# Patient Record
Sex: Male | Born: 1990 | Race: White | Hispanic: No | Marital: Married | Smoking: Never smoker
Health system: Southern US, Community
[De-identification: ages and names within clinical notes are randomized; demographics above are authoritative.]

## PROBLEM LIST (undated history)

## (undated) DIAGNOSIS — F419 Anxiety disorder, unspecified: Secondary | ICD-10-CM

## (undated) DIAGNOSIS — T7840XA Allergy, unspecified, initial encounter: Secondary | ICD-10-CM

## (undated) DIAGNOSIS — F32A Depression, unspecified: Secondary | ICD-10-CM

## (undated) DIAGNOSIS — J329 Chronic sinusitis, unspecified: Secondary | ICD-10-CM

## (undated) DIAGNOSIS — F329 Major depressive disorder, single episode, unspecified: Secondary | ICD-10-CM

## (undated) HISTORY — DX: Allergy, unspecified, initial encounter: T78.40XA

## (undated) HISTORY — DX: Major depressive disorder, single episode, unspecified: F32.9

## (undated) HISTORY — DX: Depression, unspecified: F32.A

## (undated) HISTORY — DX: Anxiety disorder, unspecified: F41.9

---
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
## 2013-05-01 ENCOUNTER — Ambulatory Visit (INDEPENDENT_AMBULATORY_CARE_PROVIDER_SITE_OTHER): Payer: BC Managed Care – PPO | Admitting: Family Medicine

## 2013-05-01 ENCOUNTER — Ambulatory Visit: Payer: BC Managed Care – PPO

## 2013-05-01 VITALS — BP 140/88 | HR 85 | Temp 98.1°F | Resp 18 | Ht 72.5 in | Wt 251.0 lb

## 2013-05-01 DIAGNOSIS — K219 Gastro-esophageal reflux disease without esophagitis: Secondary | ICD-10-CM

## 2013-05-01 DIAGNOSIS — R062 Wheezing: Secondary | ICD-10-CM

## 2013-05-01 DIAGNOSIS — R05 Cough: Secondary | ICD-10-CM

## 2013-05-01 DIAGNOSIS — R059 Cough, unspecified: Secondary | ICD-10-CM

## 2013-05-01 DIAGNOSIS — R0602 Shortness of breath: Secondary | ICD-10-CM

## 2013-05-01 LAB — POCT CBC
Granulocyte percent: 52.6 %G (ref 37–80)
HCT, POC: 49.6 % (ref 43.5–53.7)
Hemoglobin: 16.4 g/dL (ref 14.1–18.1)
Lymph, poc: 3.7 — AB (ref 0.6–3.4)
MCH, POC: 29.1 pg (ref 27–31.2)
MCHC: 33.1 g/dL (ref 31.8–35.4)
MCV: 88.1 fL (ref 80–97)
MID (cbc): 1 — AB (ref 0–0.9)
MPV: 8.9 fL (ref 0–99.8)
POC Granulocyte: 5.2 (ref 2–6.9)
POC LYMPH PERCENT: 37.8 %L (ref 10–50)
POC MID %: 9.6 %M (ref 0–12)
Platelet Count, POC: 334 10*3/uL (ref 142–424)
RBC: 5.63 M/uL (ref 4.69–6.13)
RDW, POC: 13.6 %
WBC: 9.9 10*3/uL (ref 4.6–10.2)

## 2013-05-01 MED ORDER — RANITIDINE HCL 150 MG PO TABS
150.0000 mg | ORAL_TABLET | Freq: Two times a day (BID) | ORAL | Status: DC
Start: 2013-05-01 — End: 2013-10-31

## 2013-05-01 MED ORDER — FLUTICASONE FUROATE 200 MCG/ACT IN AEPB: 1.0000 | INHALATION_SPRAY | Freq: Every day | RESPIRATORY_TRACT | Status: DC

## 2013-05-01 MED ORDER — ALBUTEROL SULFATE (2.5 MG/3ML) 0.083% IN NEBU
2.5000 mg | INHALATION_SOLUTION | Freq: Once | RESPIRATORY_TRACT | Status: AC
  Administered 2013-05-01: 2.5 mg via RESPIRATORY_TRACT

## 2013-05-01 MED ORDER — HYDROCOD POLST-CHLORPHEN POLST 10-8 MG/5ML PO LQCR: 5.0000 mL | Freq: Two times a day (BID) | ORAL | Status: DC | PRN

## 2013-05-01 MED ORDER — METHYLPREDNISOLONE ACETATE 80 MG/ML IJ SUSP
80.0000 mg | Freq: Once | INTRAMUSCULAR | Status: AC
  Administered 2013-05-01: 80 mg via INTRAMUSCULAR

## 2013-05-01 MED ORDER — IPRATROPIUM BROMIDE 0.02 % IN SOLN
0.5000 mg | Freq: Once | RESPIRATORY_TRACT | Status: AC
  Administered 2013-05-01: 0.5 mg via RESPIRATORY_TRACT

## 2013-05-01 MED ORDER — ALBUTEROL SULFATE HFA 108 (90 BASE) MCG/ACT IN AERS: 2.0000 | INHALATION_SPRAY | RESPIRATORY_TRACT | Status: DC | PRN

## 2013-05-01 NOTE — Progress Notes (Signed)
Subjective:    Patient ID: Justin Jordan, male    DOB: Jun 09, 1990, 23 y.o.   MRN: [id number]  HPI 23 year old male presents for evaluation of cough, nasal congestion, wheezing, SOB, sinus pressure, and chest tightness x 3 months.  He has been seen at another UC and treated with a 2 week course of erythromycin and prednisone in January and then with amoxicillin last week. States neither course of antibiotics helped at all. He has continued to be short of breath and have wheezing, especially at night.  Has not used any inhalers during the course of this illness.  Denies fever, chills, nausea, vomiting, otalgia, or sore throat. Does have quite a bit of nasal drainage and mucous.   No hx of asthma and has never used inhalers before.  Denies any use of cigarettes.  He is otherwise healthy with no other concerns today.  Lives with his girlfriend who has similar symptoms but has not been sick for as long.      Review of Systems  Constitutional: Negative for fever and chills.  HENT: Positive for congestion, postnasal drip, rhinorrhea and sinus pressure. Negative for sore throat and trouble swallowing.   Respiratory: Positive for cough, chest tightness, shortness of breath and wheezing.   Gastrointestinal: Negative for nausea and vomiting.  Neurological: Negative for dizziness and headaches.       Objective:   Physical Exam  Constitutional: He is oriented to person, place, and time. He appears well-developed and well-nourished.  HENT:  Head: Normocephalic and atraumatic.  Right Ear: Hearing, tympanic membrane, external ear and ear canal normal.  Left Ear: Hearing, tympanic membrane, external ear and ear canal normal.  Mouth/Throat: Uvula is midline, oropharynx is clear and moist and mucous membranes are normal.  Eyes: Conjunctivae are normal.  Neck: Normal range of motion. Neck supple.  Cardiovascular: Normal rate, regular rhythm and normal heart sounds.   Pulmonary/Chest: Effort normal.  He has wheezes.  Lymphadenopathy:    He has no cervical adenopathy.  Neurological: He is alert and oriented to person, place, and time.  Psychiatric: He has a normal mood and affect. His behavior is normal. Judgment and thought content normal.   First breathing tx with albuterol/atrovent - increased wheezing. Second tx with albuterol, wheezing improved slightly.   [facility] reading (PRIMARY) by  Dr. Milus Glazier as no acute infiltrate or consolidation.  Pre-nebulizer peak flow 500 Post is 550    Assessment & Plan:  Cough - Plan: POCT CBC, DG Chest 2 View, chlorpheniramine-HYDROcodone (TUSSIONEX PENNKINETIC ER) 10-8 MG/5ML LQCR, Ambulatory referral to Allergy, albuterol (PROVENTIL) (2.5 MG/3ML) 0.083% nebulizer solution 2.5 mg  Wheezing - Plan: DG Chest 2 View, albuterol (PROVENTIL) (2.5 MG/3ML) 0.083% nebulizer solution 2.5 mg, ipratropium (ATROVENT) nebulizer solution 0.5 mg, Ambulatory referral to Allergy, methylPREDNISolone acetate (DEPO-MEDROL) injection 80 mg, albuterol (PROVENTIL) (2.5 MG/3ML) 0.083% nebulizer solution 2.5 mg, albuterol (PROVENTIL HFA;VENTOLIN HFA) 108 (90 BASE) MCG/ACT inhaler  Shortness of breath - Plan: Fluticasone Furoate (ARNUITY ELLIPTA) 200 MCG/ACT AEPB, Ambulatory referral to Allergy, methylPREDNISolone acetate (DEPO-MEDROL) injection 80 mg, albuterol (PROVENTIL) (2.5 MG/3ML) 0.083% nebulizer solution 2.5 mg  GERD (gastroesophageal reflux disease) - Plan: ranitidine (ZANTAC) 150 MG tablet  Due to length of illness, I feel that a referral to Pulm/Allergy is indicated. He has no hx of asthma or RAD he may have developed an allergy to dust or mold that is in his house.  Will treat with Arnuity inhaler daily and albuterol to use q4-6hours prn wheezing.  Tussionex  at bedtime.  Recommend Zyrtec daily in the morning and Zantac bid.  RTC precautions discussed.  Referral placed for allergy eval - may follow up here as needed.

## 2013-05-01 NOTE — Patient Instructions (Addendum)
Take Zyrtec daily in the morning and Zantac twice daily Use tussionex cough syrup 1 teaspoon at bedtime Inhaler to use once daily Albuterol inhalter every 4-6 hours Referral to allergist placed.

## 2013-05-03 ENCOUNTER — Telehealth: Payer: Self-pay

## 2013-05-03 ENCOUNTER — Institutional Professional Consult (permissible substitution): Payer: Self-pay | Admitting: Internal Medicine

## 2013-05-03 NOTE — Telephone Encounter (Signed)
PA needed for ;arnuity ellipta inhaler. Since pt has not previously tried/failed any alternatives this PA would not be approved. Can we send in Rx for alternative please?

## 2013-05-04 ENCOUNTER — Encounter: Payer: Self-pay | Admitting: Internal Medicine

## 2013-05-04 ENCOUNTER — Ambulatory Visit (INDEPENDENT_AMBULATORY_CARE_PROVIDER_SITE_OTHER): Payer: BC Managed Care – PPO | Admitting: Internal Medicine

## 2013-05-04 VITALS — BP 134/82 | HR 63 | Temp 97.3°F | Ht 72.0 in | Wt 252.0 lb

## 2013-05-04 DIAGNOSIS — J45909 Unspecified asthma, uncomplicated: Secondary | ICD-10-CM

## 2013-05-04 MED ORDER — AMOXICILLIN-POT CLAVULANATE 875-125 MG PO TABS: 1.0000 | ORAL_TABLET | Freq: Two times a day (BID) | ORAL | Status: DC

## 2013-05-04 MED ORDER — PREDNISONE 10 MG PO TABS: ORAL_TABLET | ORAL | Status: DC

## 2013-05-04 MED ORDER — BUDESONIDE-FORMOTEROL FUMARATE 160-4.5 MCG/ACT IN AERO: INHALATION_SPRAY | RESPIRATORY_TRACT | Status: DC

## 2013-05-04 NOTE — Patient Instructions (Signed)
Augmentin 875 mg take one pill twice daily  X 10 days - take at breakfast and supper with large glass of water.  It would help reduce the usual side effects (diarrhea and yeast infections) if you ate cultured yogurt at lunch.   Prednisone 10 mg take  4 each am x 2 days,   2 each am x 2 days,  1 each am x 2 days and stop   Zantac 150 mg one after breakfast  and at bedtime  symbicort 160 Take 2 puffs first thing in am and then another 2 puffs about 12 hours later.    Only use your albuterol as a rescue medication to be used if you can't catch your breath by resting or doing a relaxed purse lip breathing pattern.  - The less you use it, the better it will work when you need it. - Ok to use up to 2 puffs  every 4 hours if you must but call for immediate appointment if use goes up over your usual need - Don't leave home without it !!  (think of it like the spare tire for your car)    Please schedule a follow up office visit in 2 weeks, sooner if needed

## 2013-05-04 NOTE — Progress Notes (Signed)
Subjective:    Patient ID: Justin ConnorsMichael Jordan, male    DOB: [birth year]  MRN: [id number]  HPI  [age] yowm never smoker  with pattern of recurrent cough in setting of earaches, nasal congestion, sorethroat usually fall > spring "for as long as he can remember"  but sometimes also early summer and always better p prednisone until the same pattern late November and needing saba in addition to otc but still not back to baseline  so referred 05/04/2013  by [facility] for cough.   05/04/2013 1st [facility] office visit/ Alexandra Lipps  Chief Complaint  Patient presents with  . Pulmonary Consult    Referred per Rhoderick MoodyHeather Marte, PA. Pt c/o cough since Nov 2014- prod with minimal green brown sputum. He also c/o SOB over the past 3-4 wks- esp first thing in the am.    as soon sit up in am starts coughing and gagging and also when lies down freq awakening better p saba. Sob even if not coughing - using albuterol up to 4 x daily   No obvious other patterns in day to day or daytime variabilty or assoc  cp or chest tightness, subjective wheeze overt  symptoms. No unusual exp hx or h/o childhood pna/ asthma or knowledge of premature birth.  Sleeping ok without nocturnal  or early am exacerbation  of respiratory  c/o's or need for noct saba. Also denies any obvious fluctuation of symptoms with weather or environmental changes or other aggravating or alleviating factors except as outlined above   Current Medications, Allergies, Complete Past Medical History, Past Surgical History, Family History, and Social History were reviewed in [facility] electronic medical record.             Review of Systems  Constitutional: Negative for fever, chills, activity change, appetite change and unexpected weight change.  HENT: Positive for congestion and trouble swallowing. Negative for dental problem, postnasal drip, rhinorrhea, sneezing, sore throat and voice change.   Eyes: Negative for visual disturbance.  Respiratory:  Positive for cough and shortness of breath. Negative for choking.   Cardiovascular: Negative for chest pain and leg swelling.  Gastrointestinal: Negative for nausea, vomiting and abdominal pain.  Genitourinary: Negative for difficulty urinating.       Heartburn Indigestion  Musculoskeletal: Negative for arthralgias.  Skin: Negative for rash.  Psychiatric/Behavioral: Negative for behavioral problems and confusion.       Objective:   Physical Exam  amb wm nad  Wt Readings from Last 3 Encounters:  05/04/13 252 lb (114.306 kg)  05/01/13 251 lb (113.853 kg)  12/21/08 221 lb (100.245 kg) (98%*, Z = 2.03)   * Growth percentiles are based on CDC 2-20 Years data.     HEENT: nl dentition, turbinates, and orophanx. Nl external ear canals without cough reflex   NECK :  without JVD/Nodes/TM/ nl carotid upstrokes bilaterally   LUNGS: no acc muscle use, clear to A and P bilaterally p am albuterol without cough on insp or exp maneuvers   CV:  RRR  no s3 or murmur or increase in P2, no edema   ABD:  soft and nontender with nl excursion in the supine position. No bruits or organomegaly, bowel sounds nl  MS:  warm without deformities, calf tenderness, cyanosis or clubbing  SKIN: warm and dry without lesions    NEURO:  alert, approp, no deficits     CXR  04/1613 .The heart size and mediastinal contours are within normal limits.  Both lungs are clear.  The visualized skeletal structures are  unremarkable.         Assessment & Plan:

## 2013-05-05 NOTE — Telephone Encounter (Signed)
We have coupons for 1 yr of this medication free - can give one to pt, or can change medications - whichever pt would prefer

## 2013-05-05 NOTE — Telephone Encounter (Signed)
Spoke to pt- Pt has already had a discount card and has been getting his rx free since his visit. No note in chart that indicated pt received discount card.

## 2013-05-06 DIAGNOSIS — J45909 Unspecified asthma, uncomplicated: Secondary | ICD-10-CM | POA: Insufficient documentation

## 2013-05-06 HISTORY — DX: Unspecified asthma, uncomplicated: J45.909

## 2013-05-06 NOTE — Assessment & Plan Note (Addendum)
DDX of  difficult airways managment all start with A and  include Adherence, Ace Inhibitors, Acid Reflux, Active Sinus Disease, Alpha 1 Antitripsin deficiency, Anxiety masquerading as Airways dz,  ABPA,  allergy(esp in young), Aspiration (esp in elderly), Adverse effects of DPI,  Active smokers, plus two Bs  = Bronchiectasis and Beta blocker use..and one C= CHF  In this case Adherence is the biggest issue and starts with  inability to use HFA effectively and also  understand that SABA treats the symptoms but doesn't get to the underlying problem (inflammation).  I used  the analogy of putting steroid cream on a rash to help explain the meaning of topical therapy and the need to get the drug to the target tissue.   - The proper method of use, as well as anticipated side effects, of a metered-dose inhaler are discussed and demonstrated to the patient. Improved effectiveness after extensive coaching during this visit to a level of approximately  75% so start on symbicort 160 2bid and see if alb dependency is eliminated   ? Acid (or non-acid) GERD > always difficult to exclude as up to 75% of pts in some series report no assoc GI/ Heartburn symptoms> rec max (24h)  acid suppression and diet restrictions/ reviewed and instructions given in writing.     ? Active sinus dz > Augmentin 875 mg take one pill twice daily  X 10 days -  Sinus ct on return if not better.   ? Allergy > suggested by seasonal pattern > Prednisone 10 mg take  4 each am x 2 days,   2 each am x 2 days,  1 each am x 2 days and stop and allergy profile on return

## 2013-05-18 ENCOUNTER — Encounter: Payer: Self-pay | Admitting: Internal Medicine

## 2013-05-18 ENCOUNTER — Ambulatory Visit (INDEPENDENT_AMBULATORY_CARE_PROVIDER_SITE_OTHER): Payer: BC Managed Care – PPO | Admitting: Internal Medicine

## 2013-05-18 ENCOUNTER — Ambulatory Visit (INDEPENDENT_AMBULATORY_CARE_PROVIDER_SITE_OTHER)
Admission: RE | Admit: 2013-05-18 | Discharge: 2013-05-18 | Disposition: A | Discharge status: Home / Self Care | Payer: BC Managed Care – PPO | Source: Ambulatory Visit | Attending: Internal Medicine | Admitting: Internal Medicine

## 2013-05-18 ENCOUNTER — Other Ambulatory Visit: Payer: Self-pay | Admitting: Internal Medicine

## 2013-05-18 VITALS — BP 124/80 | HR 63 | Temp 98.4°F | Ht 72.0 in | Wt 249.0 lb

## 2013-05-18 DIAGNOSIS — J310 Chronic rhinitis: Secondary | ICD-10-CM

## 2013-05-18 DIAGNOSIS — J45909 Unspecified asthma, uncomplicated: Secondary | ICD-10-CM

## 2013-05-18 MED ORDER — ALBUTEROL SULFATE HFA 108 (90 BASE) MCG/ACT IN AERS: 2.0000 | INHALATION_SPRAY | RESPIRATORY_TRACT | Status: DC | PRN

## 2013-05-18 MED ORDER — BUDESONIDE-FORMOTEROL FUMARATE 80-4.5 MCG/ACT IN AERO: INHALATION_SPRAY | RESPIRATORY_TRACT | Status: DC

## 2013-05-18 NOTE — Patient Instructions (Signed)
symbicort 80 Take 2 puffs first thing in am and then another 2 puffs about 12 hours later.   Work on inhaler technique:  relax and gently blow all the way out then take a nice smooth deep breath back in, triggering the inhaler at same time you start breathing in.  Hold for up to 5 seconds if you can.  Rinse and gargle with water when done   If your mouth or throat starts to bother you,   I suggest you time the inhaler to your dental care and after using the inhaler(s) brush teeth and tongue with a baking soda containing toothpaste and when you rinse this out, gargle with it first to see if this helps your mouth and throat.     Only use your albuterol (proair) as a rescue medication to be used if you can't catch your breath by resting or doing a relaxed purse lip breathing pattern.  - The less you use it, the better it will work when you need it. - Ok to use up to 2 puffs  every 4 hours if you must but call for immediate appointment if use goes up over your usual need - Don't leave home without it !!  (think of it like the spare tire for your car)   Please see patient coordinator before you leave today  to schedule sinus ct   Please schedule a follow up visit in 3 months but call sooner if needed

## 2013-05-18 NOTE — Progress Notes (Signed)
Subjective:    Patient ID: Justin Jordan, male    DOB: [birth year]  MRN: [id number]    Brief patient profile:  23 yowm never smoker  with pattern of recurrent cough in setting of earaches, nasal congestion, sorethroat usually fall > spring "for as long as he can remember"  but sometimes also early summer and always better p prednisone until the same pattern late November and needing saba in addition to otc but still not back to baseline  so referred 05/04/2013  by [facility](Heather Marte PA)  for cough.   05/04/2013 1st [facility] office visit/ Kelilah Hebard  Chief Complaint  Patient presents with  . Pulmonary Consult    Referred per Rhoderick Moody, PA. Pt c/o cough since Nov 2014- prod with minimal green brown sputum. He also c/o SOB over the past 3-4 wks- esp first thing in the am.    as soon sit up in am starts coughing and gagging and also when lies down freq awakening better p saba. Sob even if not coughing - using albuterol up to 4 x daily  rec Augmentin 875 mg take one pill twice daily  X 10 days - take at breakfast and supper with large glass of water.  It would help reduce the usual side effects (diarrhea and yeast infections) if you ate cultured yogurt at lunch.  Prednisone 10 mg take  4 each am x 2 days,   2 each am x 2 days,  1 each am x 2 days and stop  Zantac 150 mg one after breakfast  and at bedtime symbicort 160 Take 2 puffs first thing in am and then another 2 puffs about 12 hours later.  Only use your albuterol as a rescue medication   05/18/2013 f/u ov/Emalie Mcwethy re: asthma/ ? Sinus dz  Chief Complaint  Patient presents with  . Follow-up    Pt states that his breathing has improved since last visit. He has used rescue inhaler x 2 in the past 2 wks. He states cough has pretty much resolved, but still has some PND.   did not use symbiocort 80  On am ov  Not limited by breathing from desired activities   Main complaints are persistent nasal and ear congestion no better p  augmentin but no sign purulent secretions  No obvious day to day or daytime variabilty or assoc chronic cough or cp or chest tightness, subjective wheeze overt  hb symptoms. No unusual exp hx or h/o childhood pna/ asthma or knowledge of premature birth.  Sleeping ok without nocturnal  or early am exacerbation  of respiratory  c/o's or need for noct saba. Also denies any obvious fluctuation of symptoms with weather or environmental changes or other aggravating or alleviating factors except as outlined above   Current Medications, Allergies, Complete Past Medical History, Past Surgical History, Family History, and Social History were reviewed in [facility] record.  ROS  The following are not active complaints unless bolded sore throat, dysphagia, dental problems, itching, sneezing,  nasal congestion or excess/ purulent secretions, ear ache,   fever, chills, sweats, unintended wt loss, pleuritic or exertional cp, hemoptysis,  orthopnea pnd or leg swelling, presyncope, palpitations, heartburn, abdominal pain, anorexia, nausea, vomiting, diarrhea  or change in bowel or urinary habits, change in stools or urine, dysuria,hematuria,  rash, arthralgias, visual complaints, headache, numbness weakness or ataxia or problems with walking or coordination,  change in mood/affect or memory.  Objective:   Physical Exam  amb wm nad  05/18/2013          249  Wt Readings from Last 3 Encounters:  05/04/13 252 lb (114.306 kg)  05/01/13 251 lb (113.853 kg)  12/21/08 221 lb (100.245 kg) (98%*, Z = 2.03)   * Growth percentiles are based on CDC 2-20 Years data.     HEENT: nl dentition, turbinates, and orophanx. Nl external ear canals without cough reflex   NECK :  without JVD/Nodes/TM/ nl carotid upstrokes bilaterally   LUNGS: no acc muscle use, clear to A and P bilaterally  without cough on insp or exp maneuvers   CV:  RRR  no s3 or murmur or  increase in P2, no edema   ABD:  soft and nontender with nl excursion in the supine position. No bruits or organomegaly, bowel sounds nl  MS:  warm without deformities, calf tenderness, cyanosis or clubbing  SKIN: warm and dry without lesions    NEURO:  alert, approp, no deficits     CXR  04/1613 The heart size and mediastinal contours are within normal limits.  Both lungs are clear. The visualized skeletal structures are  unremarkable.         Assessment & Plan:

## 2013-05-20 NOTE — Assessment & Plan Note (Addendum)
Will check sinus ct and if neg rec trial of zyrtec vs 1st gen H1 depending on what he tolerates

## 2013-05-20 NOTE — Assessment & Plan Note (Signed)
Much better than baseline.  The proper method of use, as well as anticipated side effects, of a metered-dose inhaler are discussed and demonstrated to the patient. Improved effectiveness after extensive coaching during this visit to a level of approximately  75% so still has a ways to go to perfect it.    Each maintenance medication was reviewed in detail including most importantly the difference between maintenance and as needed and under what circumstances the prns are to be used.  Please see instructions for details which were reviewed in writing and the patient given a copy.

## 2013-05-22 ENCOUNTER — Telehealth: Payer: Self-pay | Admitting: Internal Medicine

## 2013-05-22 NOTE — Progress Notes (Signed)
Quick Note:  [id number] ______ 

## 2013-05-22 NOTE — Telephone Encounter (Signed)
Called and spoke with pt and he is aware of CT results per MW.  Pt voiced his understanding and nothing further is needed.

## 2013-05-23 ENCOUNTER — Telehealth: Payer: Self-pay | Admitting: *Deleted

## 2013-05-23 NOTE — Telephone Encounter (Signed)
Message copied by Christen ButterASKIN, Mantaj Chamberlin M on Tue May 23, 2013  1:06 PM ------      Message from: Nyoka CowdenWERT, Lequan B      Created: Sat May 20, 2013 11:28 AM       Since sinus Ct neg best choices for pnds = zyrtec 10 mg daily or chlortrimeton 4 mg q6 prn but the latter causes more drowsiness, both best to take at hs at least initially  ------

## 2013-05-23 NOTE — Telephone Encounter (Signed)
LMTCB

## 2013-05-25 ENCOUNTER — Telehealth: Payer: Self-pay | Admitting: Internal Medicine

## 2013-05-25 MED ORDER — MOMETASONE FURO-FORMOTEROL FUM 100-5 MCG/ACT IN AERO: 2.0000 | INHALATION_SPRAY | Freq: Two times a day (BID) | RESPIRATORY_TRACT | Status: DC

## 2013-05-25 NOTE — Telephone Encounter (Signed)
Pt is aware of inhaler change. rx sent in

## 2013-05-25 NOTE — Telephone Encounter (Signed)
Spoke with the pt and notified of recs per MW  He verbalized understanding  Nothing further needed  

## 2013-05-25 NOTE — Telephone Encounter (Signed)
dulera 100 2bid is fine

## 2013-05-25 NOTE — Telephone Encounter (Signed)
Received PA for symbicort 80/4.5 from [facility] [address] Per rep, the med will not be covered until he tries and fails advair and dulera Please advise thanks!

## 2013-06-07 ENCOUNTER — Ambulatory Visit (INDEPENDENT_AMBULATORY_CARE_PROVIDER_SITE_OTHER): Payer: BC Managed Care – PPO | Admitting: Family Medicine

## 2013-06-07 VITALS — BP 140/90 | HR 88 | Temp 97.2°F | Resp 18 | Ht 69.0 in | Wt 252.6 lb

## 2013-06-07 DIAGNOSIS — F4320 Adjustment disorder, unspecified: Secondary | ICD-10-CM

## 2013-06-07 MED ORDER — CLONAZEPAM 0.5 MG PO TABS: 0.2500 mg | ORAL_TABLET | Freq: Two times a day (BID) | ORAL | Status: DC | PRN

## 2013-06-07 MED ORDER — SERTRALINE HCL 50 MG PO TABS: 50.0000 mg | ORAL_TABLET | Freq: Every day | ORAL | Status: DC

## 2013-06-07 NOTE — Progress Notes (Signed)
   Subjective:    Patient ID: Justin Jordan, Justin Jordan    DOB: [birth year], 23 y.o.   MRN: [id number]  HPI Patient presents for consider of stress reaction. Has been working a new job as a Engineer, materialssecurity officer for 1 month. Job is very stressful because of dealing with people and feels tense the whole time he is there. Feels unsafe at work because of stories about what has happened to other people that have done his job in the past. Feels tense and on edge all the time at work and now feels that he is bring it home with him. Girlfriend and family have commented that he is lashing out.  Has tried getting plenty of sleep, listening to music, and eating healthy. Gets anxious in large crowds but not otherwise prone to anxiety. Does feel a little depressed at times. No SI/HI.  Review of Systems  All other systems reviewed and are negative.      Objective:   Physical Exam  Constitutional: He is oriented to person, place, and time. He appears well-developed and well-nourished. No distress.  HENT:  Head: Normocephalic and atraumatic.  Eyes: Conjunctivae are normal. No scleral icterus.  Cardiovascular: Normal rate.   Pulmonary/Chest: Effort normal. No respiratory distress.  Musculoskeletal: Normal range of motion.  Neurological: He is alert and oriented to person, place, and time.  Skin: Skin is warm and dry. He is not diaphoretic.  Psychiatric: He has a normal mood and affect. His behavior is normal.  Patient appears slightly anxious.    Assessment & Plan:  #1. Adjustment reaction/Depression/Anxiety Discuss stress management techniques - Exercise 30 min per day - Deep breathing - In 4 sec, hold 4 sec, out 4 sec - Journaling - Meditation or yoga - Counseling - Daily gratitudes  Start depression/anxiety treatment - Zoloft 50 mg daily - Klonopin prn. No refills. Wean. - Caution about sexual side effects and black box warning - F/u 4 weeks.

## 2013-06-07 NOTE — Patient Instructions (Addendum)
Thank you for coming in today  Here are some helpful stress management techniques - Exercise 30 min per day - Deep breathing - In 4 sec, hold 4 sec, out 4 sec - Journaling - Meditation or yoga - Counseling - Daily gratitudes  Start zoloft 50 mg daily Okay to use clonazepam as needed for anxiety/irritability Followup in 4 weeks. Dr. Neomia DearVoss is here on Weds nights Call if any thoughts of self harm  Adjustment Disorder Most changes in life can cause stress. Getting used to changes may take a few months or longer. If feelings of stress, hopelessness, or worry continue, you may have an adjustment disorder. This stress-related mental health problem may affect your feelings, thinking and how you act. It occurs in both sexes and happens at any age. SYMPTOMS  Some of the following problems may be seen and vary from person to person:  Sadness or depression.  Loss of enjoyment.  Thoughts of suicide.  Fighting.  Avoiding family and friends.  Poor school performance.  Hopelessness, sense of loss.  Trouble sleeping.  Vandalism.  Worry, weight loss or gain.  Crying spells.  Anxiety  Reckless driving.  Skipping school.  Poor work International aid/development workerperformance.  Nervousness.  Ignoring bills.  Poor attitude. DIAGNOSIS  Your caregiver will ask what has happened in your life and do a physical exam. They will make a diagnosis of an adjustment disorder when they are sure another problem or medical illness causing your feelings does not exist. TREATMENT  When problems caused by stress interfere with you daily life or last longer than a few months, you may need counseling for an adjustment disorder. Early treatment may diminish problems and help you to better cope with the stressful events in your life. Sometimes medication is necessary. Individual counseling and or support groups can be very helpful. PROGNOSIS  Adjustment disorders usually last less than 3 to 6 months. The condition may persist if  there is long lasting stress. This could include health problems, relationship problems, or job difficulties where you can not easily escape from what is causing the problem. PREVENTION  Even the most mentally healthy, highly functioning people can suffer from an adjustment disorder given a significant blow from a life-changing event. There is no way to prevent pain and loss. Most people need help from time to time. You are not alone. SEEK MEDICAL CARE IF:  Your feelings or symptoms listed above do not improve or worsen. Document Released: 10/07/2005 Document Revised: 04/27/2011 Document Reviewed: 12/29/2006 [facility] Patient Information 2014 [place], [place].

## 2013-08-05 ENCOUNTER — Other Ambulatory Visit: Payer: Self-pay | Admitting: Family Medicine

## 2013-09-19 ENCOUNTER — Other Ambulatory Visit: Payer: Self-pay | Admitting: Family Medicine

## 2013-10-04 ENCOUNTER — Ambulatory Visit (INDEPENDENT_AMBULATORY_CARE_PROVIDER_SITE_OTHER): Payer: BC Managed Care – PPO | Admitting: Family Medicine

## 2013-10-04 VITALS — BP 138/86 | HR 78 | Temp 98.0°F | Resp 18 | Ht 72.0 in | Wt 255.0 lb

## 2013-10-04 DIAGNOSIS — L237 Allergic contact dermatitis due to plants, except food: Secondary | ICD-10-CM

## 2013-10-04 DIAGNOSIS — L255 Unspecified contact dermatitis due to plants, except food: Secondary | ICD-10-CM

## 2013-10-04 DIAGNOSIS — F320 Major depressive disorder, single episode, mild: Secondary | ICD-10-CM

## 2013-10-04 MED ORDER — PREDNISONE 20 MG PO TABS: ORAL_TABLET | ORAL | Status: DC

## 2013-10-04 MED ORDER — SERTRALINE HCL 50 MG PO TABS: 50.0000 mg | ORAL_TABLET | Freq: Every day | ORAL | Status: DC

## 2013-10-04 NOTE — Progress Notes (Signed)
[facility] [address], [place] [address] [phone]  Date:  10/04/2013   Name:  Justin ConnorsMichael Jordan   DOB:  11-06-1990   MRN:  [id number]  PCP:  Karna DupesMASSENBURG,O'LAF, PA-C    Chief Complaint: Poison Ivy   History of Present Illness:  Justin Jordan is a 23 y.o. very pleasant male patient who presents with the following:  He is here today to follow-up PI.  He did some yard work about 10 days ago.  He noted worsening PI sx over the last few days. He has used an OTC topical treatment which was helping.  He had a major area on his right calf- this is mostly dried out now but he does notice a few small areas popping out on his arms.  He notes he tends to get PI "and I need something to make it go away."   He started zoloft a few months and and is doing well with it- he would like to refill this today  Patient Active Problem List   Diagnosis Date Noted  . Adjustment reaction 06/07/2013  . Chronic rhinitis 05/18/2013  . Asthma, chronic 05/06/2013  . INGROWN TOENAIL 12/21/2008    Past Medical History  Diagnosis Date  . Allergy   . Anxiety   . Depression     No past surgical history on file.  History  Substance Use Topics  . Smoking status: Never Smoker   . Smokeless tobacco: Never Used  . Alcohol Use: 0.5 - 4.0 oz/week    1-8 drink(s) per week    Family History  Problem Relation Age of Onset  . Asthma Neg Hx     Allergies  Allergen Reactions  . Penicillins Rash    Medication list has been reviewed and updated.  Current Outpatient Prescriptions on File Prior to Visit  Medication Sig Dispense Refill  . ranitidine (ZANTAC) 150 MG tablet Take 1 tablet (150 mg total) by mouth 2 (two) times daily.  60 tablet  0  . albuterol (PROAIR HFA) 108 (90 BASE) MCG/ACT inhaler Inhale 2 puffs into the lungs every 4 (four) hours as needed for wheezing or shortness of breath.  1 Inhaler  1  . cetirizine (ZYRTEC) 10 MG tablet Take 10 mg by mouth daily.      .  clonazePAM (KLONOPIN) 0.5 MG tablet Take 0.5-1 tablets (0.25-0.5 mg total) by mouth 2 (two) times daily as needed for anxiety.  30 tablet  0  . mometasone-formoterol (DULERA) 100-5 MCG/ACT AERO Inhale 2 puffs into the lungs 2 (two) times daily.  1 Inhaler  3  . sertraline (ZOLOFT) 50 MG tablet Take 1 tablet (50 mg total) by mouth daily.  30 tablet  1   No current facility-administered medications on file prior to visit.    Review of Systems:  As per HPI- otherwise negative.   Physical Examination: Filed Vitals:   10/04/13 1557  BP: 138/86  Pulse: 78  Temp: 98 F (36.7 C)  Resp: 18   Filed Vitals:   10/04/13 1557  Height: 6' (1.829 m)  Weight: 255 lb (115.667 kg)   Body mass index is 34.58 kg/(m^2). Ideal Body Weight: Weight in (lb) to have BMI = 25: 183.9  GEN: WDWN, NAD, Non-toxic, A & O x 3, overweight, looks well HEENT: Atraumatic, Normocephalic. Neck supple. No masses, No LAD.  Bilateral TM wnl, oropharynx normal.  PEERL,EOMI.   Ears and Nose: No external deformity. CV: RRR, No M/G/R. No JVD. No thrill.  No extra heart sounds. PULM: CTA B, no wheezes, crackles, rhonchi. No retractions. No resp. distress. No accessory muscle use. EXTR: No c/c/e NEURO Normal gait.  PSYCH: Normally interactive. Conversant. Not depressed or anxious appearing.  Calm demeanor.  He has a few tiny areas of rhus dermatitis on his arms.  There is a large patch on his right lateral calf that appears to be mostly healed and dried out   Assessment and Plan: Poison ivy - Plan: predniSONE (DELTASONE) 20 MG tablet  Major depressive disorder, single episode, mild - Plan: sertraline (ZOLOFT) 50 MG tablet  He has mild rhus dermatitis.  Explained that this will self- resolve and that treatment is not required for cure.  However he feels strongly that he needs prednisone.  Did a short taper, and refilled his zoloft    Signed Abbe Amsterdam, MD

## 2013-10-04 NOTE — Patient Instructions (Signed)
Use the prednisone as directed.  Let us know if you do not feel better soon!

## 2013-10-31 ENCOUNTER — Ambulatory Visit (INDEPENDENT_AMBULATORY_CARE_PROVIDER_SITE_OTHER): Payer: BC Managed Care – PPO | Admitting: Family Medicine

## 2013-10-31 VITALS — BP 138/70 | HR 81 | Temp 97.9°F | Resp 16 | Ht 72.0 in | Wt 253.0 lb

## 2013-10-31 DIAGNOSIS — J0100 Acute maxillary sinusitis, unspecified: Secondary | ICD-10-CM

## 2013-10-31 DIAGNOSIS — J0101 Acute recurrent maxillary sinusitis: Secondary | ICD-10-CM

## 2013-10-31 DIAGNOSIS — M76899 Other specified enthesopathies of unspecified lower limb, excluding foot: Secondary | ICD-10-CM

## 2013-10-31 DIAGNOSIS — M7051 Other bursitis of knee, right knee: Secondary | ICD-10-CM

## 2013-10-31 MED ORDER — PREDNISONE 20 MG PO TABS: 40.0000 mg | ORAL_TABLET | Freq: Every day | ORAL | Status: DC

## 2013-10-31 MED ORDER — AMOXICILLIN 875 MG PO TABS: 875.0000 mg | ORAL_TABLET | Freq: Two times a day (BID) | ORAL | Status: DC
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
## 2014-03-15 ENCOUNTER — Emergency Department (INDEPENDENT_AMBULATORY_CARE_PROVIDER_SITE_OTHER)
Admission: EM | Admit: 2014-03-15 | Discharge: 2014-03-15 | Disposition: A | Discharge status: Home / Self Care | Payer: BLUE CROSS/BLUE SHIELD | Source: Home / Self Care | Attending: Family Medicine | Admitting: Family Medicine

## 2014-03-15 ENCOUNTER — Encounter: Payer: Self-pay | Admitting: *Deleted

## 2014-03-15 ENCOUNTER — Emergency Department (INDEPENDENT_AMBULATORY_CARE_PROVIDER_SITE_OTHER): Payer: BLUE CROSS/BLUE SHIELD

## 2014-03-15 DIAGNOSIS — J209 Acute bronchitis, unspecified: Secondary | ICD-10-CM

## 2014-03-15 DIAGNOSIS — R0989 Other specified symptoms and signs involving the circulatory and respiratory systems: Secondary | ICD-10-CM

## 2014-03-15 DIAGNOSIS — R05 Cough: Secondary | ICD-10-CM

## 2014-03-15 HISTORY — DX: Chronic sinusitis, unspecified: J32.9

## 2014-03-15 MED ORDER — IPRATROPIUM-ALBUTEROL 0.5-2.5 (3) MG/3ML IN SOLN
3.0000 mL | Freq: Once | RESPIRATORY_TRACT | Status: AC
Start: 2014-03-15 — End: 2014-03-15
  Administered 2014-03-15: 3 mL via RESPIRATORY_TRACT

## 2014-03-15 MED ORDER — IPRATROPIUM BROMIDE 0.06 % NA SOLN: 2.0000 | Freq: Four times a day (QID) | NASAL | Status: DC

## 2014-03-15 MED ORDER — GUAIFENESIN-CODEINE 100-10 MG/5ML PO SOLN: 5.0000 mL | Freq: Every evening | ORAL | Status: DC | PRN

## 2014-03-15 MED ORDER — PREDNISONE 10 MG PO TABS: 30.0000 mg | ORAL_TABLET | Freq: Every day | ORAL | Status: DC

## 2014-03-15 NOTE — Discharge Instructions (Signed)
Thank you for coming in today. Do not drive after taking codeine.  Call or go to the emergency room if you get worse, have trouble breathing, have chest pains, or palpitations.    Acute Bronchitis Bronchitis is inflammation of the airways that extend from the windpipe into the lungs (bronchi). The inflammation often causes mucus to develop. This leads to a cough, which is the most common symptom of bronchitis.  In acute bronchitis, the condition usually develops suddenly and goes away over time, usually in a couple weeks. Smoking, allergies, and asthma can make bronchitis worse. Repeated episodes of bronchitis may cause further lung problems.  CAUSES Acute bronchitis is most often caused by the same virus that causes a cold. The virus can spread from person to person (contagious) through coughing, sneezing, and touching contaminated objects. SIGNS AND SYMPTOMS   Cough.   Fever.   Coughing up mucus.   Body aches.   Chest congestion.   Chills.   Shortness of breath.   Sore throat.  DIAGNOSIS  Acute bronchitis is usually diagnosed through a physical exam. Your health care provider will also ask you questions about your medical history. Tests, such as chest X-rays, are sometimes done to rule out other conditions.  TREATMENT  Acute bronchitis usually goes away in a couple weeks. Oftentimes, no medical treatment is necessary. Medicines are sometimes given for relief of fever or cough. Antibiotic medicines are usually not needed but may be prescribed in certain situations. In some cases, an inhaler may be recommended to help reduce shortness of breath and control the cough. A cool mist vaporizer may also be used to help thin bronchial secretions and make it easier to clear the chest.  HOME CARE INSTRUCTIONS  Get plenty of rest.   Drink enough fluids to keep your urine clear or pale yellow (unless you have a medical condition that requires fluid restriction). Increasing fluids may  help thin your respiratory secretions (sputum) and reduce chest congestion, and it will prevent dehydration.   Take medicines only as directed by your health care provider.  If you were prescribed an antibiotic medicine, finish it all even if you start to feel better.  Avoid smoking and secondhand smoke. Exposure to cigarette smoke or irritating chemicals will make bronchitis worse. If you are a smoker, consider using nicotine gum or skin patches to help control withdrawal symptoms. Quitting smoking will help your lungs heal faster.   Reduce the chances of another bout of acute bronchitis by washing your hands frequently, avoiding people with cold symptoms, and trying not to touch your hands to your mouth, nose, or eyes.   Keep all follow-up visits as directed by your health care provider.  SEEK MEDICAL CARE IF: Your symptoms do not improve after 1 week of treatment.  SEEK IMMEDIATE MEDICAL CARE IF:  You develop an increased fever or chills.   You have chest pain.   You have severe shortness of breath.  You have bloody sputum.   You develop dehydration.  You faint or repeatedly feel like you are going to pass out.  You develop repeated vomiting.  You develop a severe headache. MAKE SURE YOU:   Understand these instructions.  Will watch your condition.  Will get help right away if you are not doing well or get worse. Document Released: 03/12/2004 Document Revised: 06/19/2013 Document Reviewed: 07/26/2012 [facility] Patient Information 2015 [employer] [place]. This information is not intended to replace advice given to you by your health care provider. Make sure  you discuss any questions you have with your health care provider.

## 2014-03-15 NOTE — ED Notes (Signed)
Justin Jordan c/o productive cough with brown sputum and congestion. C/o fever, aches, chills x 6 days. Using day/nyquil otc. No flu vac this season

## 2014-03-15 NOTE — ED Provider Notes (Signed)
Justin Jordan is a 24 y.o. male who presents to Urgent Care today for cough. Patient has a productive cough of brown sputum present for the last 5-6 days. This is associated with some wheezing and shortness of breath. No fevers or chills nausea vomiting or diarrhea. Patient has tried DayQuil and NyQuil which have helped a bit. No flu vaccination yet.   Past Medical History  Diagnosis Date  . Allergy   . Anxiety   . Depression   . Chronic sinus infection    History reviewed. No pertinent past surgical history. History  Substance Use Topics  . Smoking status: Never Smoker   . Smokeless tobacco: Never Used  . Alcohol Use: 0.5 - 4.0 oz/week    1-8 Not specified per week   ROS as above Medications: No current facility-administered medications for this encounter.   Current Outpatient Prescriptions  Medication Sig Dispense Refill  . guaiFENesin-codeine 100-10 MG/5ML syrup Take 5 mLs by mouth at bedtime as needed for cough. 120 mL 0  . ipratropium (ATROVENT) 0.06 % nasal spray Place 2 sprays into both nostrils 4 (four) times daily. 15 mL 1  . predniSONE (DELTASONE) 10 MG tablet Take 3 tablets (30 mg total) by mouth daily. 15 tablet 0   Allergies  Allergen Reactions  . Penicillins Rash     Exam:  BP 131/83 mmHg  Pulse 86  Temp(Src) 98.2 F (36.8 C) (Oral)  Resp 16  Ht 6' (1.829 m)  Wt 267 lb (121.11 kg)  BMI 36.20 kg/m2  SpO2 96% Gen: Well NAD HEENT: EOMI,  MMM history pharynx with cobblestoning. Normal tympanic membranes bilaterally. Lungs: Normal work of breathing. Coarse breath sounds right lung field Heart: RRR no MRG Abd: NABS, Soft. Nondistended, Nontender Exts: Brisk capillary refill, warm and well perfused.   Patient was given a 2.5/0.5 DuoNeb nebulizer treatment, and did not feel much better  No results found for this or any previous visit (from the past 24 hour(s)). Dg Chest 2 View  03/15/2014   CLINICAL DATA:  Cough, congestion, and body aches, nonsmoker,  history of chronic asthma  EXAM: CHEST  2 VIEW  COMPARISON:  PA and lateral chest x-ray of May 01, 2013  FINDINGS: The lungs are adequately inflated. There is no alveolar infiltrate. The interstitial markings are coarse. The heart and pulmonary vascularity appear normal. The mediastinum is normal in width. There is no pleural effusion. The bony thorax is unremarkable.  IMPRESSION: Acute bronchitic change.  There is no evidence of pneumonia.   Electronically Signed   By: David  [place]azil[place]   On: 03/15/2014 13:22    Assessment and Plan: 24 y.o. male with bronchitis. Treat with prednisone and Atrovent nasal spray and codeine cough syrup.  Discussed warning signs or symptoms. Please see discharge instructions. Patient expresses understanding.     [name], MD 03/15/14 1350

## 2016-03-10 ENCOUNTER — Encounter: Payer: Self-pay | Admitting: Family Medicine

## 2016-03-10 ENCOUNTER — Ambulatory Visit (INDEPENDENT_AMBULATORY_CARE_PROVIDER_SITE_OTHER): Payer: BLUE CROSS/BLUE SHIELD | Admitting: Family Medicine

## 2016-03-10 VITALS — BP 130/77 | HR 72 | Temp 98.8°F | Resp 16 | Ht 72.0 in | Wt 256.8 lb

## 2016-03-10 DIAGNOSIS — J3089 Other allergic rhinitis: Secondary | ICD-10-CM | POA: Diagnosis not present

## 2016-03-10 DIAGNOSIS — Z131 Encounter for screening for diabetes mellitus: Secondary | ICD-10-CM

## 2016-03-10 DIAGNOSIS — Z6834 Body mass index (BMI) 34.0-34.9, adult: Secondary | ICD-10-CM | POA: Diagnosis not present

## 2016-03-10 DIAGNOSIS — Z23 Encounter for immunization: Secondary | ICD-10-CM | POA: Diagnosis not present

## 2016-03-10 DIAGNOSIS — Z0000 Encounter for general adult medical examination without abnormal findings: Secondary | ICD-10-CM

## 2016-03-10 DIAGNOSIS — Z113 Encounter for screening for infections with a predominantly sexual mode of transmission: Secondary | ICD-10-CM

## 2016-03-10 DIAGNOSIS — Z13220 Encounter for screening for lipoid disorders: Secondary | ICD-10-CM

## 2016-03-10 DIAGNOSIS — E6609 Other obesity due to excess calories: Secondary | ICD-10-CM

## 2016-03-10 NOTE — Patient Instructions (Addendum)
   IF you received an x-ray today, you will receive an invoice from [facility]. Please contact [facility] at [phone] with questions or concerns regarding your invoice.   IF you received labwork today, you will receive an invoice from [facility]. Please contact [facility] at [phone] with questions or concerns regarding your invoice.   Our billing staff will not be able to assist you with questions regarding bills from these companies.  You will be contacted with the lab results as soon as they are available. The fastest way to get your results is to activate your My Chart account. Instructions are located on the last page of this paperwork. If you have not heard from us regarding the results in 2 weeks, please contact this office.     Keeping you healthy  Get these tests  Blood pressure- Have your blood pressure checked once a year by your healthcare provider.  Normal blood pressure is 120/80.  Weight- Have your body mass index (BMI) calculated to screen for obesity.  BMI is a measure of body fat based on height and weight. You can also calculate your own BMI at www.nhlbisupport.com/bmi/.  Cholesterol- Have your cholesterol checked regularly starting at age 35, sooner may be necessary if you have diabetes, high blood pressure, if a family member developed heart diseases at an early age or if you smoke.   Chlamydia, HIV, and other sexual transmitted disease- Get screened each year until the age of 25 then within three months of each new sexual partner.  Diabetes- Have your blood sugar checked regularly if you have high blood pressure, high cholesterol, a family history of diabetes or if you are overweight.  Get these vaccines  Flu shot- Every fall.  Tetanus shot- Every 10 years.  Menactra- Single dose; prevents meningitis.  Take these steps  Don't smoke- If you do smoke, ask your healthcare provider about quitting. For tips on how to quit, go to  www.smokefree.gov or call 1-800-QUIT-NOW.  Be physically active- Exercise 5 days a week for at least 30 minutes.  If you are not already physically active start slow and gradually work up to 30 minutes of moderate physical activity.  Examples of moderate activity include walking briskly, mowing the yard, dancing, swimming bicycling, etc.  Eat a healthy diet- Eat a variety of healthy foods such as fruits, vegetables, low fat milk, low fat cheese, yogurt, lean meats, poultry, fish, beans, tofu, etc.  For more information on healthy eating, go to www.thenutritionsource.org  Drink alcohol in moderation- Limit alcohol intake two drinks or less a day.  Never drink and drive.  Dentist- Brush and floss teeth twice daily; visit your dentis twice a year.  Depression-Your emotional health is as important as your physical health.  If you're feeling down, losing interest in things you normally enjoy please talk with your healthcare provider.  Gun Safety- If you keep a gun in your home, keep it unloaded and with the safety lock on.  Bullets should be stored separately.  Helmet use- Always wear a helmet when riding a motorcycle, bicycle, rollerblading or skateboarding.  Safe sex- If you may be exposed to a sexually transmitted infection, use a condom  Seat belts- Seat bels can save your life; always wear one.  Smoke/Carbon Monoxide detectors- These detectors need to be installed on the appropriate level of your home.  Replace batteries at least once a year.  Skin Cancer- When out in the sun, cover up and use sunscreen SPF 15 or   higher.  Violence- If anyone is threatening or hurting you, please tell your healthcare provider. 

## 2016-03-10 NOTE — Progress Notes (Signed)
Subjective:    Patient ID: Justin Jordan, male    DOB: 1990/06/12, 26 y.o.   MRN: [id number]  03/10/2016  Annual Exam (pre-employment)   HPI This 26 y.o. male presents for Complete Physical Examination.  Last physical:  01-2013 Eye exam:  never Dental exam:  Extractions in March 2017  Immunization History  Administered Date(s) Administered  . DTP 05/15/1991, 07/24/1991, 12/22/1991, 07/17/1992  . Hepatitis B 04/03/1991, 05/15/1991, 12/22/1991  . HiB (PRP-OMP) 05/15/1991, 07/24/1991, 12/22/1991, 07/17/1992  . Influenza Split 11/16/2012  . Influenza Whole 12/21/2008  . MMR 07/17/1992, 09/26/1998  . OPV 05/15/1991, 07/24/1991, 07/17/1992  . Td 09/26/1998  . Tdap 03/10/2016   BP Readings from Last 3 Encounters:  03/10/16 130/77  03/15/14 131/83  10/31/13 138/70   Wt Readings from Last 3 Encounters:  03/10/16 256 lb 12.8 oz (116.5 kg)  03/15/14 267 lb (121.1 kg)  10/31/13 253 lb (114.8 kg)   PND/nasal congestion: s/p pulmonology consultation; no asthma; started Zyrtec, nasal sprays without improvement.  Zyrtec, Flonase, Rhinocort Aqua, Claritin, Allegra.  Three year duration.  Nasal congestion daily for three years.     Visual Acuity Screening   Right eye Left eye Both eyes  Without correction: 20/13 20/15 20/13  With correction:      Review of Systems  Constitutional: Negative for activity change, appetite change, chills, diaphoresis, fatigue, fever and unexpected weight change.  HENT: Positive for postnasal drip. Negative for congestion, dental problem, drooling, ear discharge, ear pain, facial swelling, hearing loss, mouth sores, nosebleeds, rhinorrhea, sinus pressure, sneezing, sore throat, tinnitus, trouble swallowing and voice change.   Eyes: Negative for photophobia, pain, discharge, redness, itching and visual disturbance.  Respiratory: Negative for apnea, cough, choking, chest tightness, shortness of breath, wheezing and stridor.   Cardiovascular: Negative for  chest pain, palpitations and leg swelling.  Gastrointestinal: Negative for abdominal pain, blood in stool, constipation, diarrhea, nausea and vomiting.  Endocrine: Negative for cold intolerance, heat intolerance, polydipsia, polyphagia and polyuria.  Genitourinary: Negative for decreased urine volume, difficulty urinating, discharge, dysuria, enuresis, flank pain, frequency, genital sores, hematuria, penile pain, penile swelling, scrotal swelling, testicular pain and urgency.  Musculoskeletal: Negative for arthralgias, back pain, gait problem, joint swelling, myalgias, neck pain and neck stiffness.  Skin: Negative for color change, pallor, rash and wound.  Allergic/Immunologic: Negative for environmental allergies, food allergies and immunocompromised state.  Neurological: Negative for dizziness, tremors, seizures, syncope, facial asymmetry, speech difficulty, weakness, light-headedness, numbness and headaches.  Hematological: Negative for adenopathy. Does not bruise/bleed easily.  Psychiatric/Behavioral: Negative for agitation, behavioral problems, confusion, decreased concentration, dysphoric mood, hallucinations, self-injury, sleep disturbance and suicidal ideas. The patient is not nervous/anxious and is not hyperactive.     Past Medical History:  Diagnosis Date  . Allergy   . Anxiety   . Chronic sinus infection   . Depression    History reviewed. No pertinent surgical history. Allergies  Allergen Reactions  . Penicillins Rash   Current Outpatient Prescriptions  Medication Sig Dispense Refill  . guaiFENesin-codeine 100-10 MG/5ML syrup Take 5 mLs by mouth at bedtime as needed for cough. (Patient not taking: Reported on 03/10/2016) 120 mL 0  . ipratropium (ATROVENT) 0.06 % nasal spray Place 2 sprays into both nostrils 4 (four) times daily. (Patient not taking: Reported on 03/10/2016) 15 mL 1  . predniSONE (DELTASONE) 10 MG tablet Take 3 tablets (30 mg total) by mouth daily. (Patient not  taking: Reported on 03/10/2016) 15 tablet 0   No current facility-administered medications for  this visit.    Social History   Social History  . Marital status: Single    Spouse name: N/A  . Number of children: N/A  . Years of education: N/A   Occupational History  . Cashier at [employer] History Main Topics  . Smoking status: Never Smoker  . Smokeless tobacco: Never Used  . Alcohol use 0.5 - 4.0 oz/week    1 - 8 Standard drinks or equivalent per week  . Drug use: No  . Sexual activity: Not on file   Other Topics Concern  . Not on file   Social History Narrative   Marital status: married since 10/2015.      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  Total partners = 12.  Last STD screening 2011.   Family History  Problem Relation Age of Onset  . Hypertension Father   . Asthma Neg Hx        Objective:    BP 130/77 (BP Location: Right Arm, Patient Position: Sitting, Cuff Size: Small)   Pulse 72   Temp 98.8 F (37.1 C) (Oral)   Resp 16   Ht 6' (1.829 m)   Wt 256 lb 12.8 oz (116.5 kg)   SpO2 97%   BMI 34.83 kg/m  Physical Exam  Constitutional: He is oriented to person, place, and time. He appears well-developed and well-nourished. No distress.  HENT:  Head: Normocephalic and atraumatic.  Right Ear: External ear normal.  Left Ear: External ear normal.  Nose: Nose normal.  Mouth/Throat: Oropharynx is clear and moist.  Eyes: Conjunctivae and EOM are normal. Pupils are equal, round, and reactive to light.  Neck: Normal range of motion. Neck supple. Carotid bruit is not present. No thyromegaly present.  Cardiovascular: Normal rate, regular rhythm,  normal heart sounds and intact distal pulses.  Exam reveals no gallop and no friction rub.   No murmur heard. Pulmonary/Chest: Effort normal and breath sounds normal. He has no wheezes. He has no rales.  Abdominal: Soft. Bowel sounds are normal. He exhibits no distension and no mass. There is no tenderness. There is no rebound and no guarding. Hernia confirmed negative in the right inguinal area and confirmed negative in the left inguinal area.  Genitourinary: Testes normal and penis normal.  Musculoskeletal:       Right shoulder: Normal.       Left shoulder: Normal.       Cervical back: Normal.  Lymphadenopathy:    He has no cervical adenopathy.       Right: No inguinal adenopathy present.  Neurological: He is alert and oriented to person, place, and time. He has normal reflexes. No cranial nerve deficit. He exhibits normal muscle tone. Coordination normal.  Skin: Skin is warm and dry. No rash noted. He is not diaphoretic.  Psychiatric: He has a normal mood and affect. His behavior is normal. Judgment and thought content normal.   Depression screen [facility] 2/9 03/10/2016  Decreased Interest 0  Down, Depressed, Hopeless 0  PHQ -  2 Score 0        Assessment & Plan:   1. Routine physical examination   2. Screening for diabetes mellitus   3. Screening, lipid   4. Screening examination for STD (sexually transmitted disease)   5. Chronic nonseasonal allergic rhinitis due to pollen   6. Need for Tdap vaccination   7. Class 1 obesity due to excess calories without serious comorbidity with body mass index (BMI) of 34.0 to 34.9 in adult    -anticipatory guidance --- exercise, weight loss, low-calorie food choices. -s/p TDAP -obtain age appropriate screening labs. -refer to allergy & immunology due to chronic allergic rhinitis/PND/etc.   Orders Placed This Encounter  Procedures  . GC/Chlamydia Probe Amp  . Tdap vaccine greater than or equal to 7yo IM  . CBC with Differential/Platelet  .  Comprehensive metabolic panel    Order Specific Question:   Has the patient fasted?    Answer:   Yes  . Hemoglobin A1c  . Lipid panel    Order Specific Question:   Has the patient fasted?    Answer:   Yes  . TSH  . HIV antibody  . RPR  . Ambulatory referral to Allergy    Referral Priority:   Routine    Referral Type:   Allergy Testing    Referral Reason:   Specialty Services Required    Requested Specialty:   Allergy    Number of Visits Requested:   1   No orders of the defined types were placed in this encounter.   No Follow-up on file.   Yuritza Paulhus Elayne Guerin, M.D. [facility] [address] [place], [place]  [address] [phone] phone [phone] fax

## 2016-03-11 LAB — LIPID PANEL
Chol/HDL Ratio: 5.6 ratio units — ABNORMAL HIGH (ref 0.0–5.0)
Cholesterol, Total: 207 mg/dL — ABNORMAL HIGH (ref 100–199)
HDL: 37 mg/dL — ABNORMAL LOW (ref >39)
LDL CALC: 127 mg/dL — AB (ref 0–99)
TRIGLYCERIDES: 214 mg/dL — AB (ref 0–149)
VLDL CHOLESTEROL CAL: 43 mg/dL — AB (ref 5–40)

## 2016-03-11 LAB — COMPREHENSIVE METABOLIC PANEL
A/G RATIO: 2.2 (ref 1.2–2.2)
ALBUMIN: 5.2 g/dL (ref 3.5–5.5)
ALT: 79 IU/L — ABNORMAL HIGH (ref 0–44)
AST: 37 IU/L (ref 0–40)
Alkaline Phosphatase: 80 IU/L (ref 39–117)
BUN/Creatinine Ratio: 12 (ref 9–20)
BUN: 11 mg/dL (ref 6–20)
Bilirubin Total: 0.6 mg/dL (ref 0.0–1.2)
CO2: 19 mmol/L (ref 18–29)
Calcium: 9.6 mg/dL (ref 8.7–10.2)
Chloride: 100 mmol/L (ref 96–106)
Creatinine, Ser: 0.94 mg/dL (ref 0.76–1.27)
GFR, EST AFRICAN AMERICAN: 130 mL/min/{1.73_m2} (ref >59)
GFR, EST NON AFRICAN AMERICAN: 112 mL/min/{1.73_m2} (ref >59)
Globulin, Total: 2.4 g/dL (ref 1.5–4.5)
Glucose: 83 mg/dL (ref 65–99)
POTASSIUM: 4.4 mmol/L (ref 3.5–5.2)
Sodium: 142 mmol/L (ref 134–144)
TOTAL PROTEIN: 7.6 g/dL (ref 6.0–8.5)

## 2016-03-11 LAB — CBC WITH DIFFERENTIAL/PLATELET
Basophils Absolute: 0.1 10*3/uL (ref 0.0–0.2)
Basos: 1 %
EOS (ABSOLUTE): 0.1 10*3/uL (ref 0.0–0.4)
Eos: 1 %
HEMOGLOBIN: 16.2 g/dL (ref 13.0–17.7)
Hematocrit: 46.9 % (ref 37.5–51.0)
IMMATURE GRANS (ABS): 0 10*3/uL (ref 0.0–0.1)
Immature Granulocytes: 0 %
LYMPHS: 34 %
Lymphocytes Absolute: 2.9 10*3/uL (ref 0.7–3.1)
MCH: 30.1 pg (ref 26.6–33.0)
MCHC: 34.5 g/dL (ref 31.5–35.7)
MCV: 87 fL (ref 79–97)
MONOCYTES: 8 %
Monocytes Absolute: 0.7 10*3/uL (ref 0.1–0.9)
NEUTROS PCT: 56 %
Neutrophils Absolute: 4.9 10*3/uL (ref 1.4–7.0)
PLATELETS: 280 10*3/uL (ref 150–379)
RBC: 5.38 x10E6/uL (ref 4.14–5.80)
RDW: 12.6 % (ref 12.3–15.4)
WBC: 8.7 10*3/uL (ref 3.4–10.8)

## 2016-03-11 LAB — RPR: RPR Ser Ql: NONREACTIVE

## 2016-03-11 LAB — HEMOGLOBIN A1C
Est. average glucose Bld gHb Est-mCnc: 97 mg/dL
Hgb A1c MFr Bld: 5 % (ref 4.8–5.6)

## 2016-03-11 LAB — TSH: TSH: 1.91 u[IU]/mL (ref 0.450–4.500)

## 2016-03-11 LAB — HIV ANTIBODY (ROUTINE TESTING W REFLEX): HIV Screen 4th Generation wRfx: NONREACTIVE

## 2016-03-13 LAB — GC/CHLAMYDIA PROBE AMP
Chlamydia trachomatis, NAA: NEGATIVE
Neisseria gonorrhoeae by PCR: NEGATIVE

## 2017-06-10 ENCOUNTER — Encounter: Payer: Self-pay | Admitting: Physician Assistant

## 2017-06-10 ENCOUNTER — Ambulatory Visit (INDEPENDENT_AMBULATORY_CARE_PROVIDER_SITE_OTHER): Payer: BLUE CROSS/BLUE SHIELD | Admitting: Physician Assistant

## 2017-06-10 ENCOUNTER — Other Ambulatory Visit: Payer: Self-pay

## 2017-06-10 VITALS — BP 132/88 | HR 75 | Temp 98.1°F | Resp 18 | Ht 72.0 in | Wt 257.2 lb

## 2017-06-10 DIAGNOSIS — M542 Cervicalgia: Secondary | ICD-10-CM | POA: Diagnosis not present

## 2017-06-10 MED ORDER — CYCLOBENZAPRINE HCL 10 MG PO TABS: 5.0000 mg | ORAL_TABLET | Freq: Three times a day (TID) | ORAL | 0 refills | Status: DC | PRN

## 2017-06-10 MED ORDER — NAPROXEN 500 MG PO TABS: 500.0000 mg | ORAL_TABLET | Freq: Two times a day (BID) | ORAL | 0 refills | Status: DC

## 2017-06-10 NOTE — Patient Instructions (Addendum)
  Use the meds as prescribed.  A heating pad would also help.  Mychart me in 10 days if you are still symptomatic and we can try you on some prednisone.    IF you received an x-ray today, you will receive an invoice from [facility]. Please contact [facility] at [phone] with questions or concerns regarding your invoice.   IF you received labwork today, you will receive an invoice from [facility]. Please contact [facility] at [phone] with questions or concerns regarding your invoice.   Our billing staff will not be able to assist you with questions regarding bills from these companies.  You will be contacted with the lab results as soon as they are available. The fastest way to get your results is to activate your My Chart account. Instructions are located on the last page of this paperwork. If you have not heard from us regarding the results in 2 weeks, please contact this office.

## 2017-06-13 NOTE — Progress Notes (Signed)
06/13/2017 9:23 PM   DOB: 1990-03-28 / MRN: [id number]  SUBJECTIVE:  Justin Jordan is a 27 y.o. male presenting he is tried for left-sided neck pain x3 days.  This started after awakening from sleep.  Patient reports it feels like a "terrible crick in the neck."  He denies upper extremity weakness.  He is a never smoker.  No bowel or bladder incontinence.  He has tried a few doses of 200 mg ibuprofen here and there with minimal relief of his symptoms.  He is allergic to ginger and penicillins.   He  has a past medical history of Allergy, Anxiety, Chronic sinus infection, and Depression.    He  reports that he has never smoked. He has never used smokeless tobacco. He reports that he drinks about 0.5 - 4.0 oz of alcohol per week. He reports that he does not use drugs. He  has no sexual activity history on file. The patient  has no past surgical history on file.  His family history includes Hypertension in his father.  Review of Systems  Musculoskeletal: Positive for myalgias and neck pain. Negative for back pain, falls and joint pain.  Neurological: Negative for dizziness, tingling and headaches.    The problem list and medications were reviewed and updated by myself where necessary and exist elsewhere in the encounter.   OBJECTIVE:  BP 132/88   Pulse 75   Temp 98.1 F (36.7 C) (Oral)   Resp 18   Ht 6' (1.829 m)   Wt 257 lb 3.2 oz (116.7 kg)   SpO2 97%   BMI 34.88 kg/m   Physical Exam  Constitutional: He is oriented to person, place, and time. He appears well-developed. He does not appear ill.  Eyes: Pupils are equal, round, and reactive to light. Conjunctivae and EOM are normal.  Cardiovascular: Normal rate, regular rhythm, S1 normal, S2 normal, normal heart sounds, intact distal pulses and normal pulses. Exam reveals no gallop and no friction rub.  No murmur heard. Pulmonary/Chest: Effort normal. No stridor. No respiratory distress. He has no wheezes. He has no rales.    Abdominal: He exhibits no distension.  Musculoskeletal: Normal range of motion. He exhibits no edema.       Cervical back: He exhibits tenderness and spasm. He exhibits normal range of motion, no bony tenderness, no swelling, no edema, no deformity, no laceration, no pain and normal pulse.  Neurological: He is alert and oriented to person, place, and time. No cranial nerve deficit. Coordination normal.  Skin: Skin is warm and dry. He is not diaphoretic.  Psychiatric: He has a normal mood and affect.  Nursing note and vitals reviewed.   No results found for this or any previous visit (from the past 72 hour(s)).  No results found.  ASSESSMENT AND PLAN:  Justin Jordan was seen today for neck pain.  Diagnoses and all orders for this visit:  Neck pain, acute -     naproxen (NAPROSYN) 500 MG tablet; Take 1 tablet (500 mg total) by mouth 2 (two) times daily with a meal. -     cyclobenzaprine (FLEXERIL) 10 MG tablet; Take 0.5-1 tablets (5-10 mg total) by mouth 3 (three) times daily as needed for muscle spasms.    The patient is advised to call or return to clinic if he does not see an improvement in symptoms, or to seek the care of the closest emergency department if he worsens with the above plan.   Deliah Boston, MHS, PA-C Primary  Care at [facility] 06/13/2017 9:23 PM

## 2017-06-18 ENCOUNTER — Encounter: Payer: Self-pay | Admitting: Physician Assistant

## 2017-07-02 ENCOUNTER — Encounter: Payer: Self-pay | Admitting: Family Medicine

## 2017-07-07 ENCOUNTER — Encounter: Payer: Self-pay | Admitting: Family Medicine

## 2017-10-20 ENCOUNTER — Encounter: Payer: Self-pay | Admitting: Physician Assistant

## 2017-10-21 ENCOUNTER — Encounter: Payer: Self-pay | Admitting: Physician Assistant

## 2017-10-21 ENCOUNTER — Ambulatory Visit (INDEPENDENT_AMBULATORY_CARE_PROVIDER_SITE_OTHER): Payer: BLUE CROSS/BLUE SHIELD | Admitting: Physician Assistant

## 2017-10-21 ENCOUNTER — Other Ambulatory Visit: Payer: Self-pay

## 2017-10-21 VITALS — BP 132/82 | HR 73 | Temp 98.5°F | Resp 18 | Ht 72.0 in | Wt 246.8 lb

## 2017-10-21 DIAGNOSIS — J069 Acute upper respiratory infection, unspecified: Secondary | ICD-10-CM | POA: Diagnosis not present

## 2017-10-21 DIAGNOSIS — Z91018 Allergy to other foods: Secondary | ICD-10-CM | POA: Diagnosis not present

## 2017-10-21 MED ORDER — HYDROCODONE-HOMATROPINE 5-1.5 MG/5ML PO SYRP: 5.0000 mL | ORAL_SOLUTION | Freq: Three times a day (TID) | ORAL | 0 refills | Status: DC | PRN

## 2017-10-21 MED ORDER — GUAIFENESIN ER 1200 MG PO TB12: 1.0000 | ORAL_TABLET | Freq: Two times a day (BID) | ORAL | 0 refills | Status: AC

## 2017-10-21 MED ORDER — EPINEPHRINE 0.3 MG/0.3ML IJ SOAJ: 0.3000 mg | Freq: Once | INTRAMUSCULAR | 1 refills | Status: AC

## 2017-10-21 NOTE — Progress Notes (Signed)
Justin Jordan  MRN: [id number] DOB: 29-Jan-1991  PCP: Ethelda Chick, MD  Chief Complaint  Patient presents with  . Sore Throat  . Cough    x3days     Subjective:  Pt presents to clinic for cold symptoms for the last 3 days.  Symptoms started with head congestion and sore throat.  No exposures to strep that he knows of.  Currently he has chest and head congestion and some nausea with PND.  Cough with green sputum.  Rhinorrhea is also green.  2 nights ago he has subjective fever.  Myalgias mild.  Nyquil last night. Tylenol and motrin.  No sick contacts that he knows.    Pt has had a few episode recently where he ingested ginger and about 3 times his throat has gotten itchy - he has no swelling that he knows of and he takes benadryl and it resolves. The 1st time he noticed it he was aware and then he did not eat it for a few weeks and then he purposely ate it 2 more times and he had the itching all three time - he has noticed any other foods that causes this reaction.  Once was ginger ale (old fashion kind), another was ginger dressing.  History is obtained by patient.  Review of Systems  Constitutional: Positive for chills. Negative for fever (subjective but has resolved).  HENT: Positive for congestion, postnasal drip, rhinorrhea (green) and sore throat.   Respiratory: Positive for cough (green sputum).   Gastrointestinal: Positive for nausea (due to PND).  Psychiatric/Behavioral: Negative for sleep disturbance.    Patient Active Problem List   Diagnosis Date Noted  . Adjustment reaction 06/07/2013  . Chronic rhinitis 05/18/2013  . Asthma, chronic 05/06/2013  . INGROWN TOENAIL 12/21/2008    No current outpatient medications on file prior to visit.   No current facility-administered medications on file prior to visit.     Allergies  Allergen Reactions  . Ginger   . Penicillins Rash    Past Medical History:  Diagnosis Date  . Allergy   . Anxiety   . Chronic sinus  infection   . Depression    Social History   Social History Narrative   Marital status: married since 10/2015.      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  Total partners = 12.  Last STD screening 2011.   Social History   Tobacco Use  . Smoking status: Current Every Day Smoker    Types: E-cigarettes  . Smokeless tobacco: Never Used  Substance Use Topics  . Alcohol use: Yes    Alcohol/week: 1.0 - 8.0 standard drinks    Types: 1 - 8 Standard drinks or equivalent per week  . Drug use: No   family history includes Hypertension in his father.     Objective:  BP 132/82   Pulse 73   Temp 98.5 F (36.9 C) (Oral)   Resp 18   Ht 6' (1.829 m)   Wt 246 lb 12.8 oz (111.9 kg)   SpO2  98%   BMI 33.47 kg/m  Body mass index is 33.47 kg/m.  Wt Readings from Last 3 Encounters:  10/21/17 246 lb 12.8 oz (111.9 kg)  06/10/17 257 lb 3.2 oz (116.7 kg)  03/10/16 256 lb 12.8 oz (116.5 kg)    Physical Exam  Constitutional: He is oriented to person, place, and time. He appears well-developed and well-nourished.  HENT:  Head: Normocephalic and atraumatic.  Right Ear: Hearing, tympanic membrane, external ear and ear canal normal.  Left Ear: Hearing, tympanic membrane, external ear and ear canal normal.  Nose: Nose normal.  Mouth/Throat: Uvula is midline, oropharynx is clear and moist and mucous membranes are normal.  Eyes: Conjunctivae are normal.  Neck: Normal range of motion.  Cardiovascular: Normal rate, regular rhythm and normal heart sounds.  Pulmonary/Chest: Effort normal and breath sounds normal. He has no wheezes.  Lymphadenopathy:       Head (right side): No tonsillar  adenopathy present.       Head (left side): No tonsillar adenopathy present.    He has no cervical adenopathy.       Right: No supraclavicular adenopathy present.       Left: No supraclavicular adenopathy present.  Neurological: He is alert and oriented to person, place, and time.  Skin: Skin is warm and dry.  Psychiatric: Judgment normal.  Vitals reviewed.   Assessment and Plan :  URI with cough and congestion - Plan: Guaifenesin (MUCINEX MAXIMUM STRENGTH) 1200 MG TB12, HYDROcodone-homatropine (HYCODAN) 5-1.5 MG/5ML syrup  Food allergy - Plan: EPINEPHrine (EPIPEN 2-PAK) 0.3 mg/0.3 mL IJ SOAJ injection   Symptomatic treatment for URI d/w pt.  Continue good hydration.  Due to recent throat itching with ginger on several occasions epi pen given.  Benadryl current helps his symptoms but d/w pt that food allergies can get worse with time.  Patient verbalized to me that they understand the following: diagnosis, what is being done for them, what to expect and what should be done at home.  Their questions have been answered.  See after visit summary for patient specific instructions.  Benny Lennert PA-C  Primary Care at [facility] 10/26/2017 4:54 AM  Please note: Portions of this report may have been transcribed using dragon voice recognition software. Every effort was made to ensure accuracy; however, inadvertent computerized transcription errors may be present.

## 2017-10-21 NOTE — Patient Instructions (Signed)
° ° ° °  If you have lab work done today you will be contacted with your lab results within the next 2 weeks.  If you have not heard from us then please contact us. The fastest way to get your results is to register for My Chart. ° ° °IF you received an x-ray today, you will receive an invoice from [facility]. Please contact [facility] at [phone] with questions or concerns regarding your invoice.  ° °IF you received labwork today, you will receive an invoice from [facility]. Please contact [facility] at [phone] with questions or concerns regarding your invoice.  ° °Our billing staff will not be able to assist you with questions regarding bills from these companies. ° °You will be contacted with the lab results as soon as they are available. The fastest way to get your results is to activate your My Chart account. Instructions are located on the last page of this paperwork. If you have not heard from us regarding the results in 2 weeks, please contact this office. °  ° ° ° °

## 2017-10-26 ENCOUNTER — Encounter: Payer: Self-pay | Admitting: Physician Assistant

## 2018-01-05 ENCOUNTER — Telehealth: Payer: BLUE CROSS/BLUE SHIELD | Admitting: Nurse Practitioner

## 2018-01-05 DIAGNOSIS — M545 Low back pain, unspecified: Secondary | ICD-10-CM

## 2018-01-05 MED ORDER — CYCLOBENZAPRINE HCL 10 MG PO TABS: 10.0000 mg | ORAL_TABLET | Freq: Three times a day (TID) | ORAL | 0 refills | Status: DC | PRN

## 2018-01-05 MED ORDER — NAPROXEN 500 MG PO TABS: 500.0000 mg | ORAL_TABLET | Freq: Two times a day (BID) | ORAL | 0 refills | Status: DC

## 2018-01-05 NOTE — Progress Notes (Signed)

## 2018-06-20 ENCOUNTER — Encounter: Payer: Self-pay | Admitting: Physician Assistant

## 2018-06-20 ENCOUNTER — Telehealth: Payer: BLUE CROSS/BLUE SHIELD | Admitting: Physician Assistant

## 2018-06-20 DIAGNOSIS — M542 Cervicalgia: Secondary | ICD-10-CM | POA: Diagnosis not present

## 2018-06-20 MED ORDER — NAPROXEN 500 MG PO TABS: 500.0000 mg | ORAL_TABLET | Freq: Two times a day (BID) | ORAL | 0 refills | Status: DC

## 2018-06-20 MED ORDER — TIZANIDINE HCL 2 MG PO TABS: 4.0000 mg | ORAL_TABLET | Freq: Three times a day (TID) | ORAL | 0 refills | Status: DC | PRN

## 2018-06-20 NOTE — Progress Notes (Signed)

## 2018-07-15 ENCOUNTER — Telehealth: Payer: BLUE CROSS/BLUE SHIELD | Admitting: Nurse Practitioner

## 2018-07-15 DIAGNOSIS — M542 Cervicalgia: Secondary | ICD-10-CM

## 2018-07-15 NOTE — Progress Notes (Deleted)

## 2018-07-15 NOTE — Progress Notes (Signed)
Based on what you shared with me it looks like you have neck pain ,that should be evaluated in a face to face office visit. Since this is a recurrnt problem then you will need a face to face visit. The only things I could give you anyway is naprosyn and flexeril or tizadine which you have already had.   NOTE: If you entered your credit card information for this eVisit, you will not be charged. You may see a "hold" on your card for the $30 but that hold will drop off and you will not have a charge processed.  If you are having a true medical emergency please call 911.  If you need an urgent face to face visit, [facility] has four urgent care centers for your convenience.  If you need care fast and have a high deductible or no insurance consider:   [email or web address] to reserve your spot online an avoid wait times  [facility] [address] [place], [place] [address] 8 am to 8 pm Monday-Friday 10 am to 4 pm Saturday-Sunday *Across the street from [employer]  [address] [place], [address] 8 am to 5 pm Monday-Friday * In the [facility] on the [facility]   The following sites will take your  insurance:  . [facility]  [phone] Get Driving Directions Find a Provider at this Location  [address] [place], [place] [address] . 10 am to 8 pm Monday-Friday . 12 pm to 8 pm Saturday-Sunday   . [facility] at [facility]  [phone] Get Driving Directions Find a Provider at this Location  [address], [address] [place], [place] [address] . 8 am to 8 pm Monday-Friday . 9 am to 6 pm Saturday . 11 am to 6 pm Sunday   . [facility] at [facility]  [phone] Get Driving Directions  [address] [place], [place] [address] . 8 am to 8 pm Monday-Friday . 8 am to 4 pm Saturday-Sunday   Your e-visit answers were reviewed  by a board certified advanced clinical practitioner to complete your personal care plan.

## 2018-07-16 MED ORDER — TIZANIDINE HCL 2 MG PO TABS: 4.0000 mg | ORAL_TABLET | Freq: Three times a day (TID) | ORAL | 0 refills | Status: DC | PRN

## 2018-07-16 NOTE — Addendum Note (Signed)
Addended by: Bennie Pierini on: 07/16/2018 10:01 AM   Modules accepted: Orders

## 2019-02-20 ENCOUNTER — Emergency Department
Admission: EM | Admit: 2019-02-20 | Discharge: 2019-02-20 | Disposition: A | Discharge status: Home / Self Care | Payer: Self-pay | Source: Home / Self Care

## 2019-02-20 ENCOUNTER — Encounter: Payer: Self-pay | Admitting: *Deleted

## 2019-02-20 ENCOUNTER — Other Ambulatory Visit: Payer: Self-pay

## 2019-02-20 DIAGNOSIS — Z20822 Contact with and (suspected) exposure to covid-19: Secondary | ICD-10-CM

## 2019-02-20 DIAGNOSIS — R0981 Nasal congestion: Secondary | ICD-10-CM

## 2019-02-20 MED ORDER — IPRATROPIUM BROMIDE 0.03 % NA SOLN: 2.0000 | Freq: Two times a day (BID) | NASAL | 0 refills | Status: DC

## 2019-02-20 MED ORDER — LEVOCETIRIZINE DIHYDROCHLORIDE 5 MG PO TABS: 5.0000 mg | ORAL_TABLET | Freq: Every evening | ORAL | 0 refills | Status: DC

## 2019-02-20 NOTE — ED Provider Notes (Signed)
[facility]    CSN: [id number] Arrival date & time: 02/20/19  2831      History   Chief Complaint Chief Complaint  Patient presents with  . Nasal Congestion    HPI Justin Jordan is a 29 y.o. male.   HPI Justin Jordan presents today with concern of nasal congestion.  Patient reports a history of chronic sinusitis. Denies SOB. Endorses mild drainage. No ear pain or soreness of throat. Symptoms presnet less than 7 days.Patient denies any known exposure to anyone positive for COVID-19.  Patient is requesting COVID-19 test while in office today.  Past Medical History:  Diagnosis Date  . Allergy   . Anxiety   . Chronic sinus infection   . Depression     Patient Active Problem List   Diagnosis Date Noted  . Adjustment reaction 06/07/2013  . Chronic rhinitis 05/18/2013  . Asthma, chronic 05/06/2013  . INGROWN TOENAIL 12/21/2008    History reviewed. No pertinent surgical history.  Home Medications    Prior to Admission medications   Not on File    Family History Family History  Problem Relation Age of Onset  . Hypertension Father   . Asthma Neg Hx     Social History Social History   Tobacco Use  . Smoking status: Never Smoker  . Smokeless tobacco: Never Used  Substance Use Topics  . Alcohol use: Yes    Alcohol/week: 1.0 - 8.0 standard drinks    Types: 1 - 8 Standard drinks or equivalent per week  . Drug use: No     Allergies   Ginger and Penicillins Review of Systems Review of Systems Pertinent negatives listed in HPI Physical Exam Triage Vital Signs ED Triage Vitals  Enc Vitals Group     BP 02/20/19 0852 123/86     Pulse Rate 02/20/19 0852 77     Resp 02/20/19 0852 18     Temp 02/20/19 0852 98.1 F (36.7 C)     Temp Source 02/20/19 0852 Oral     SpO2 02/20/19 0852 97 %     Weight 02/20/19 0849 260 lb (117.9 kg)     Height 02/20/19 0849 6' (1.829 m)     Head Circumference --      Peak Flow --      Pain Score 02/20/19 0849 0   Pain Loc --      Pain Edu? --      Excl. in [place]? --    No data found.  Updated Vital Signs BP 123/86 (BP Location: Right Arm)   Pulse 77   Temp 98.1 F (36.7 C) (Oral)   Resp 18   Ht 6' (1.829 m)   Wt 260 lb (117.9 kg)   SpO2 97%   BMI 35.26 kg/m   Visual Acuity Right Eye Distance:   Left Eye Distance:   Bilateral Distance:    Right Eye Near:   Left Eye Near:    Bilateral Near:     Physical Exam Constitutional:      Appearance: He is not ill-appearing.  HENT:     Head: Normocephalic.     Right Ear: Hearing and external ear normal.     Left Ear: Hearing and external ear normal.     Nose: Mucosal edema, congestion and rhinorrhea present. No nasal deformity or nasal tenderness.     Right Turbinates: Swollen. Not enlarged.     Left Turbinates: Swollen. Not enlarged.  Cardiovascular:     Rate and Rhythm: Normal rate.  Pulses: Normal pulses.  Lymphadenopathy:     Cervical: No cervical adenopathy.  Skin:    General: Skin is warm.  Neurological:     Mental Status: He is alert.  Psychiatric:        Mood and Affect: Mood normal.    UC Treatments / Results  Labs (all labs ordered are listed, but only abnormal results are displayed) Labs Reviewed  NOVEL CORONAVIRUS, NAA    EKG   Radiology No results found.  Procedures Procedures (including critical care time)  Medications Ordered in UC Medications - No data to display  Initial Impression / Assessment and Plan / UC Course  I have reviewed the triage vital signs and the nursing notes.  Pertinent labs & imaging results that were available during my care of the patient were reviewed by me and considered in my medical decision making (see chart for details).   Encounter for COVID-19 testing.  Patient presents with allergic rhinitis symptoms of congestion. Symptomatic treatment indicated only given symptoms less than 7 days and patient is nontoxic appearing and has no other associated sinusitis type symptoms.  COVID-19 testing pending.  No known exposure.  Patient advised to return if symptoms worsen or do not improve. Work note provided while test results are pending.  Final Clinical Impressions(s) / UC Diagnoses   Final diagnoses:  Nasal congestion  Encounter for laboratory testing for COVID-19 virus     Discharge Instructions     All COVID-19 negative results will be sent directly to your MyChart account.  Follow-up positive results will be called personally by personnel here in the clinic.  If symptoms worsen or do not improve please return for further evaluation.    ED Prescriptions    Medication Sig Dispense Auth. Provider   ipratropium (ATROVENT) 0.03 % nasal spray Place 2 sprays into both nostrils 2 (two) times daily. 30 mL Bing Neighbors, FNP   levocetirizine (XYZAL) 5 MG tablet Take 1 tablet (5 mg total) by mouth every evening. 30 tablet Bing Neighbors, FNP     PDMP not reviewed this encounter.   Bing Neighbors, FNP 02/22/19 1318

## 2019-02-20 NOTE — ED Triage Notes (Signed)
Pt c/o nasal congestion x 1 wk. Hx of sinus infection. No know COVID exposure.

## 2019-02-20 NOTE — Discharge Instructions (Signed)
All COVID-19 negative results will be sent directly to your MyChart account.  Follow-up positive results will be called personally by personnel here in the clinic.  If symptoms worsen or do not improve please return for further evaluation.

## 2019-02-22 LAB — NOVEL CORONAVIRUS, NAA: SARS-CoV-2, NAA: NOT DETECTED

## 2019-07-03 ENCOUNTER — Telehealth: Payer: Self-pay | Admitting: Nurse Practitioner

## 2019-07-03 DIAGNOSIS — M545 Low back pain, unspecified: Secondary | ICD-10-CM

## 2019-07-03 MED ORDER — CYCLOBENZAPRINE HCL 10 MG PO TABS: 10.0000 mg | ORAL_TABLET | Freq: Three times a day (TID) | ORAL | 1 refills | Status: DC | PRN

## 2019-07-03 MED ORDER — NAPROXEN 500 MG PO TABS: 500.0000 mg | ORAL_TABLET | Freq: Two times a day (BID) | ORAL | 1 refills | Status: DC

## 2019-07-03 NOTE — Progress Notes (Signed)

## 2019-10-30 ENCOUNTER — Emergency Department (INDEPENDENT_AMBULATORY_CARE_PROVIDER_SITE_OTHER)
Admission: EM | Admit: 2019-10-30 | Discharge: 2019-10-30 | Disposition: A | Discharge status: Home / Self Care | Payer: BLUE CROSS/BLUE SHIELD | Source: Home / Self Care

## 2019-10-30 ENCOUNTER — Other Ambulatory Visit: Payer: Self-pay

## 2019-10-30 ENCOUNTER — Emergency Department (INDEPENDENT_AMBULATORY_CARE_PROVIDER_SITE_OTHER): Payer: BLUE CROSS/BLUE SHIELD

## 2019-10-30 DIAGNOSIS — M542 Cervicalgia: Secondary | ICD-10-CM

## 2019-10-30 DIAGNOSIS — M62838 Other muscle spasm: Secondary | ICD-10-CM

## 2019-10-30 DIAGNOSIS — S161XXA Strain of muscle, fascia and tendon at neck level, initial encounter: Secondary | ICD-10-CM

## 2019-10-30 MED ORDER — MELOXICAM 15 MG PO TABS: 15.0000 mg | ORAL_TABLET | Freq: Every day | ORAL | 0 refills | Status: DC

## 2019-10-30 MED ORDER — METHYLPREDNISOLONE ACETATE 80 MG/ML IJ SUSP
80.0000 mg | Freq: Once | INTRAMUSCULAR | Status: AC
  Administered 2019-10-30: 80 mg via INTRAMUSCULAR

## 2019-10-30 MED ORDER — METHOCARBAMOL 500 MG PO TABS: 500.0000 mg | ORAL_TABLET | Freq: Two times a day (BID) | ORAL | 0 refills | Status: DC
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
## 2019-10-30 NOTE — Discharge Instructions (Addendum)
Meloxicam (Mobic) is an antiinflammatory to help with pain and inflammation.  Do not take ibuprofen, Advil, Aleve, or any other medications that contain NSAIDs while taking meloxicam as this may cause stomach upset or even ulcers if taken in large amounts for an extended period of time.   Robaxin (methocarbamol) is a muscle relaxer and may cause drowsiness. Do not drink alcohol, drive, or operate heavy machinery while taking.  Call to schedule a follow up appointment with Sports Medicine and Primary Care for ongoing healthcare needs and recheck of symptoms later this week if not improving.
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
## 2020-02-04 ENCOUNTER — Telehealth: Payer: BLUE CROSS/BLUE SHIELD | Admitting: Nurse Practitioner

## 2020-02-04 DIAGNOSIS — J019 Acute sinusitis, unspecified: Secondary | ICD-10-CM

## 2020-02-04 MED ORDER — DOXYCYCLINE HYCLATE 100 MG PO TABS: 100.0000 mg | ORAL_TABLET | Freq: Two times a day (BID) | ORAL | 0 refills | Status: AC

## 2020-02-04 MED ORDER — FLUTICASONE PROPIONATE 50 MCG/ACT NA SUSP
2.0000 | Freq: Every day | NASAL | 0 refills | Status: DC
Start: 2020-02-04 — End: 2021-10-06

## 2020-02-04 NOTE — Progress Notes (Signed)
We are sorry that you are not feeling well.  Here is how we plan to help!  Based on what you have shared with me it looks like you have sinusitis.  Sinusitis is inflammation and infection in the sinus cavities of the head.  Based on your presentation I believe you most likely have Acute Bacterial Sinusitis.  This is an infection caused by bacteria and is treated with antibiotics. I have prescribed Doxycycline 100mg  by mouth twice a day for 10 days.  I am also prescribing Fluticasone nasal spray, use 2 sprays in each nostril daily until symptoms improve. You may use an oral decongestant such as Mucinex D or if you have glaucoma or high blood pressure use plain Mucinex. Saline nasal spray help and can safely be used as often as needed for congestion.  If you develop worsening sinus pain, fever or notice severe headache and vision changes, or if symptoms are not better after completion of antibiotic, please schedule an appointment with a health care provider.    Sinus infections are not as easily transmitted as other respiratory infection, however we still recommend that you avoid close contact with loved ones, especially the very young and elderly.  Remember to wash your hands thoroughly throughout the day as this is the number one way to prevent the spread of infection!  Home Care:  Only take medications as instructed by your medical team.  Complete the entire course of an antibiotic.  Do not take these medications with alcohol.  A steam or ultrasonic humidifier can help congestion.  You can place a towel over your head and breathe in the steam from hot water coming from a faucet.  Avoid close contacts especially the very young and the elderly.  Cover your mouth when you cough or sneeze.  Always remember to wash your hands.  Get Help Right Away If:  You develop worsening fever or sinus pain.  You develop a severe head ache or visual changes.  Your symptoms persist after you have completed  your treatment plan.  Make sure you  Understand these instructions.  Will watch your condition.  Will get help right away if you are not doing well or get worse.  Your e-visit answers were reviewed by a board certified advanced clinical practitioner to complete your personal care plan.  Depending on the condition, your plan could have included both over the counter or prescription medications.  If there is a problem please reply  once you have received a response from your provider.  Your safety is important to .  If you have drug allergies check your prescription carefully.    You can use MyChart to ask questions about today's visit, request a non-urgent call back, or ask for a work or school excuse for 24 hours related to this e-Visit. If it has been greater than 24 hours you will need to follow up with your provider, or enter a new e-Visit to address those concerns.  You will get an e-mail in the next two days asking about your experience.  I hope that your e-visit has been valuable and will speed your recovery. Thank you for using e-visits.  I have spent at least 5 minutes reviewing and documenting in the patient's chart.

## 2020-05-17 ENCOUNTER — Emergency Department (INDEPENDENT_AMBULATORY_CARE_PROVIDER_SITE_OTHER)
Admission: EM | Admit: 2020-05-17 | Discharge: 2020-05-17 | Disposition: A | Discharge status: Home / Self Care | Payer: 59 | Source: Home / Self Care

## 2020-05-17 ENCOUNTER — Emergency Department (INDEPENDENT_AMBULATORY_CARE_PROVIDER_SITE_OTHER): Payer: 59

## 2020-05-17 DIAGNOSIS — M79672 Pain in left foot: Secondary | ICD-10-CM | POA: Diagnosis not present

## 2020-05-17 DIAGNOSIS — W010XXA Fall on same level from slipping, tripping and stumbling without subsequent striking against object, initial encounter: Secondary | ICD-10-CM

## 2020-05-17 DIAGNOSIS — S93602A Unspecified sprain of left foot, initial encounter: Secondary | ICD-10-CM

## 2020-05-17 DIAGNOSIS — M62838 Other muscle spasm: Secondary | ICD-10-CM

## 2020-05-17 MED ORDER — TRAMADOL HCL 50 MG PO TABS: 50.0000 mg | ORAL_TABLET | Freq: Four times a day (QID) | ORAL | 0 refills | Status: DC | PRN

## 2020-05-17 MED ORDER — IBUPROFEN 800 MG PO TABS: 800.0000 mg | ORAL_TABLET | Freq: Three times a day (TID) | ORAL | 0 refills | Status: DC | PRN

## 2020-05-17 MED ORDER — CYCLOBENZAPRINE HCL 10 MG PO TABS: 10.0000 mg | ORAL_TABLET | Freq: Two times a day (BID) | ORAL | 0 refills | Status: DC | PRN

## 2020-05-17 NOTE — ED Triage Notes (Signed)
Pt c/o LT foot pain since yesterday at 3 am when he tripped on one of his kids toys. Pain is mostly on outside part of foot. Pain 5/10 Ibuprofen prn.

## 2020-05-17 NOTE — ED Provider Notes (Signed)
[facility]   [id number] 05/17/20 Arrival Time: 8916  XI:[id number] PAIN  SUBJECTIVE: History from: patient. Justin Jordan is a 30 y.o. male complains of L foot pain that began overnight last night when he tripped over a toy. Localizes the pain to the lateral aspect of the L foot.  Describes the pain as intermittent and achy in character. Has tried OTC medications with mild relief. Symptoms are made worse with activity. Denies similar symptoms in the past. Denies fever, chills, erythema, ecchymosis, weakness, numbness and tingling, saddle paresthesias, loss of bowel or bladder function.      ROS: As per HPI.  All other pertinent ROS negative.     Past Medical History:  Diagnosis Date  . Allergy   . Anxiety   . Chronic sinus infection   . Depression    History reviewed. No pertinent surgical history. Allergies  Allergen Reactions  . Ginger   . Penicillins Rash   No current facility-administered medications on file prior to encounter.   Current Outpatient Medications on File Prior to Encounter  Medication Sig Dispense Refill  . fluticasone (FLONASE) 50 MCG/ACT nasal spray Place 2 sprays into both nostrils daily for 14 days. 16 g 0  . ipratropium (ATROVENT) 0.03 % nasal spray Place 2 sprays into both nostrils 2 (two) times daily. 30 mL 0  . levocetirizine (XYZAL) 5 MG tablet Take 1 tablet (5 mg total) by mouth every evening. 30 tablet 0  . meloxicam (MOBIC) 15 MG tablet Take 1 tablet (15 mg total) by mouth daily. 14 tablet 0  . methocarbamol (ROBAXIN) 500 MG tablet Take 1 tablet (500 mg total) by mouth 2 (two) times daily. 20 tablet 0   Social History   Socioeconomic History  . Marital status: Married    Spouse name: Not on file  . Number of children: Not on file  . Years of education: Not on file  . Highest education level: Not on file  Occupational History  . Occupation: Conservation officer, nature at [employer]: [employer]  Tobacco Use  . Smoking status: Never Smoker  .  Smokeless tobacco: Never Used  Vaping Use  . Vaping Use: Every day  . Substances: Nicotine, Flavoring  Substance and Sexual Activity  . Alcohol use: Yes    Alcohol/week: 1.0 - 8.0 standard drink    Types: 1 - 8 Standard drinks or equivalent per week  . Drug use: No  . Sexual activity: Not on file  Other Topics Concern  . Not on file  Social History Narrative   Marital status: married since 10/2015.      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  Total partners = 12.  Last STD screening 2011.   Social Determinants of Health   Financial Resource Strain: Not on file  Food Insecurity: Not on file  Transportation Needs: Not on file  Physical Activity: Not on file  Stress: Not on file  Social Connections: Not on file  Intimate Partner Violence: Not on file   Family History  Problem  Relation Age of Onset  . Hypertension Father   . Asthma Neg Hx     OBJECTIVE:  Vitals:   05/17/20 0933  BP: (!) 143/97  Pulse: 73  Resp: 17  Temp: (!) 97.5 F (36.4 C)  TempSrc: Oral  SpO2: 96%    General appearance: ALERT; in no acute distress.  Head: NCAT Lungs: Normal respiratory effort CV: pulses 2+ bilaterally. Cap refill < 2 seconds Musculoskeletal:  Inspection: Skin warm, dry, clear and intact No erythema Effusion to dorsum of L lateral foot Palpation: lateral aspect of L foot tender to palpation ROM: Limited ROM active and passive to L foot Skin: warm and dry Neurologic: Ambulates without difficulty; Sensation intact about the upper/ lower extremities Psychological: alert and cooperative; normal mood and affect  DIAGNOSTIC STUDIES:  DG Foot Complete Left  Result Date:  05/17/2020 CLINICAL DATA:  Tripping injury, lateral foot pain EXAM: LEFT FOOT - COMPLETE 3+ VIEW COMPARISON:  None. FINDINGS: There is no evidence of fracture or dislocation. There is no evidence of arthropathy or other focal bone abnormality. Soft tissues are unremarkable. IMPRESSION: Negative. Electronically Signed   By: Judie Petit.  Shick M.D.   On: 05/17/2020 09:56     ASSESSMENT & PLAN:  1. Sprain of left foot, initial encounter   2. Left foot pain   3. Muscle spasm    Xray is negative today for any fracture or misalignments Prescribed ibuprofen Prescribed tramadol for breakthrough pain Prescribed cyclobenzaprine Post op shoe applied in office Continue conservative management of rest, ice, and gentle stretches Take ibuprofen as needed for pain relief (may cause abdominal discomfort, ulcers, and GI bleeds avoid taking with other NSAIDs) Take cyclobenzaprine twice a day as needed for muscle spasms. This medication can make you sleepy. Do not drive or operate heavy machinery while taking this medication. Follow up with orthopedics if symptoms persist Suspect soft tissue injury Return or go to the ER if you have any new or worsening symptoms (fever, chills, chest pain, abdominal pain, changes in bowel or bladder habits, pain radiating into lower legs)   Reviewed expectations re: course of current medical issues. Questions answered. Outlined signs and symptoms indicating need for more acute intervention. Patient verbalized understanding. After Visit Summary given.       Moshe Cipro, NP 05/17/20 1013

## 2020-05-17 NOTE — Discharge Instructions (Addendum)
Xray today is negative for any fracture or misalignments  I have sent in ibuprofen for you to take one tablet every 8 hours as needed for pain and inflammation.  I have sent in tramadol for you to take for breakthrough pain, 1 tablet every 6 hours as needed  I have sent in flexeril for you to take twice a day as needed for muscle spasms. This medication can make you sleepy. Do not drive or operate heavy machinery with this medication.  Post op shoe applied in office today. Wear this when up and active and for comfort.  May apply ice to the area as needed, elevate the foot  Follow up with orthopedics if symptoms are persisting.  Follow up in the ER for high fever, trouble swallowing, trouble breathing, other concerning symptoms.

## 2021-05-01 ENCOUNTER — Telehealth: Payer: 59 | Admitting: Physician Assistant

## 2021-05-01 DIAGNOSIS — J329 Chronic sinusitis, unspecified: Secondary | ICD-10-CM | POA: Diagnosis not present

## 2021-05-02 MED ORDER — DOXYCYCLINE HYCLATE 100 MG PO TABS: 100.0000 mg | ORAL_TABLET | Freq: Two times a day (BID) | ORAL | 0 refills | Status: AC

## 2021-05-02 NOTE — Progress Notes (Signed)

## 2021-06-18 DIAGNOSIS — S338XXA Sprain of other parts of lumbar spine and pelvis, initial encounter: Secondary | ICD-10-CM | POA: Diagnosis not present

## 2021-06-18 DIAGNOSIS — S233XXA Sprain of ligaments of thoracic spine, initial encounter: Secondary | ICD-10-CM | POA: Diagnosis not present

## 2021-06-18 DIAGNOSIS — S134XXA Sprain of ligaments of cervical spine, initial encounter: Secondary | ICD-10-CM | POA: Diagnosis not present

## 2021-06-25 DIAGNOSIS — S338XXA Sprain of other parts of lumbar spine and pelvis, initial encounter: Secondary | ICD-10-CM | POA: Diagnosis not present

## 2021-06-25 DIAGNOSIS — S233XXA Sprain of ligaments of thoracic spine, initial encounter: Secondary | ICD-10-CM | POA: Diagnosis not present

## 2021-06-25 DIAGNOSIS — S134XXA Sprain of ligaments of cervical spine, initial encounter: Secondary | ICD-10-CM | POA: Diagnosis not present

## 2021-07-04 ENCOUNTER — Encounter: Payer: Self-pay | Admitting: Family Medicine

## 2021-07-04 ENCOUNTER — Ambulatory Visit (INDEPENDENT_AMBULATORY_CARE_PROVIDER_SITE_OTHER): Payer: 59 | Admitting: Family Medicine

## 2021-07-04 VITALS — BP 146/82 | HR 87 | Ht 72.0 in | Wt 259.8 lb

## 2021-07-04 DIAGNOSIS — K648 Other hemorrhoids: Secondary | ICD-10-CM | POA: Diagnosis not present

## 2021-07-04 DIAGNOSIS — R58 Hemorrhage, not elsewhere classified: Secondary | ICD-10-CM | POA: Diagnosis not present

## 2021-07-04 DIAGNOSIS — Z1159 Encounter for screening for other viral diseases: Secondary | ICD-10-CM | POA: Diagnosis not present

## 2021-07-04 DIAGNOSIS — E559 Vitamin D deficiency, unspecified: Secondary | ICD-10-CM

## 2021-07-04 DIAGNOSIS — B029 Zoster without complications: Secondary | ICD-10-CM | POA: Diagnosis not present

## 2021-07-04 DIAGNOSIS — R7301 Impaired fasting glucose: Secondary | ICD-10-CM | POA: Diagnosis not present

## 2021-07-04 MED ORDER — VALACYCLOVIR HCL 1 G PO TABS
1000.0000 mg | ORAL_TABLET | Freq: Three times a day (TID) | ORAL | 0 refills | Status: AC
Start: 2021-07-04 — End: 2021-07-11

## 2021-07-04 NOTE — Assessment & Plan Note (Signed)
-  Will place a referral to GI for further evaluation -continue conservative management

## 2021-07-04 NOTE — Progress Notes (Addendum)
New Patient Office Visit  Subjective:  Patient ID: Justin Jordan, male    DOB: 1990-02-28  Age: 31 y.o. MRN: [id number]  CC:  Chief Complaint  Patient presents with   New Patient (Initial Visit)    Establishing care, has a rash on his chest and on his back onset 07/04/2020. States he has noticed blood when he has bowel movement onset 07/04/2020. States he feels extremely fatigued.     HPI Justin Jordan is a 31 y.o. male with past medical history of asthma and chronic rhinitis presents for establishing care Rash: unilateral rash that comes and goes with onset on May 2022 but a recent flare-up two weeks ago. He reports burning, itching, and tingling with the rash and notes a history of chicken pox. The rash is unilateral to the right side, occurring on the same side with each flare-up. Hemorrhoids: onset of symptoms was a year ago. He notes occasional rectal bleeding, pain, burning, and stinging after wiping when he has a BM. He mentions having regular BM and denies straining. Elevated BP: denies history of high blood pressure but reports white coat syndrome. Denies headaches, dizziness, and blurred vision. Past Medical History:  Diagnosis Date   Allergy    Anxiety    Chronic sinus infection    Depression     History reviewed. No pertinent surgical history.  Family History  Problem Relation Age of Onset   Hypertension Father    Asthma Neg Hx     Social History   Socioeconomic History   Marital status: Married    Spouse name: Not on file   Number of children: Not on file   Years of education: Not on file   Highest education level: Not on file  Occupational History   Occupation: Scientist, water quality at [employer]     Employer: [employer]  Tobacco Use   Smoking status: Never   Smokeless tobacco: Never  Vaping Use   Vaping Use: Every day   Substances: Nicotine, Flavoring  Substance and Sexual Activity   Alcohol use: Yes    Alcohol/week: 1.0 - 8.0 standard drink    Types: 1 - 8  Standard drinks or equivalent per week   Drug use: No   Sexual activity: Not on file  Other Topics Concern   Not on file  Social History Narrative   Marital status: married since 10/2015.      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  Total partners = 12.  Last STD screening 2011.   Social Determinants of Health   Financial Resource Strain: Not on file  Food Insecurity: Not on file  Transportation Needs: Not on file  Physical Activity: Not on file  Stress: Not on file  Social Connections: Not on file  Intimate Partner Violence: Not on file    ROS Review of Systems  Constitutional:  Positive for fatigue. Negative for chills and fever.  HENT:  Negative for drooling, facial swelling and tinnitus.   Eyes:  Negative for pain, redness and itching.  Respiratory:  Negative for shortness  of breath and wheezing.   Cardiovascular:  Negative for chest pain and palpitations.  Gastrointestinal:  Negative for abdominal distention and abdominal pain.  Endocrine: Negative for polydipsia, polyphagia and polyuria.  Genitourinary:  Negative for scrotal swelling, testicular pain and urgency.  Musculoskeletal:  Negative for myalgias and neck stiffness.  Skin:  Positive for rash.  Neurological:  Positive for numbness. Negative for facial asymmetry.  Psychiatric/Behavioral:  Negative for confusion and suicidal ideas.    Objective:   Today's Vitals: BP (!) 146/82   Pulse 87   Ht 6' (1.829 m)   Wt 259 lb 12.8 oz (117.8 kg)   SpO2 99%   BMI 35.24 kg/m   Physical Exam HENT:     Head: Normocephalic.     Right Ear: External ear normal.     Left Ear: External ear normal.     Nose: Nose normal.      Mouth/Throat:     Mouth: Mucous membranes are moist.  Eyes:     Extraocular Movements: Extraocular movements intact.     Pupils: Pupils are equal, round, and reactive to light.  Cardiovascular:     Rate and Rhythm: Normal rate and regular rhythm.     Pulses: Normal pulses.     Heart sounds: Normal heart sounds.  Pulmonary:     Effort: Pulmonary effort is normal.     Breath sounds: Normal breath sounds.  Abdominal:     Palpations: Abdomen is soft.  Musculoskeletal:     Cervical back: No rigidity or tenderness.     Right lower leg: No edema.     Left lower leg: No edema.  Skin:    General: Skin is warm.     Capillary Refill: Capillary refill takes less than 2 seconds.     Findings: Rash (grouped 2-28mm vesicles on erythematous base on the right upper abdomen and trunk in a dermatomal distribution that does not cross the midline) present.  Neurological:     Mental Status: He is alert and oriented to person, place, and time.  Psychiatric:     Comments: Normal affect    Assessment & Plan:   Problem List Items Addressed This Visit       Other   Herpes zoster - Primary    Assessment findings consistent with herpes zooster Will treat with valtrex for 7 days  Instructions provided to prevent spreading the rash To prevent spreading VZV to others: -Cover the rash. -Avoid touching or scratching the rash. -Wash your hands often. -Avoid contact with the following people until your rash crusts: pregnant women who have never had chickenpox or the chickenpox vaccine;premature or low birth weight infants; and people with weakened immune systems, such as people receiving immunosuppressive medications or undergoing chemotherapy, organ transplant recipients, and people with human immunodeficiency virus (HIV) infection.      Relevant Medications   valACYclovir (VALTREX) 1000 MG tablet   Other Relevant Orders   CBC with Differential/Platelet   CMP14+EGFR   Lipid panel   TSH + free  T4   Internal hemorrhage    -Will place a referral to GI for further evaluation -continue conservative management        Other Visit Diagnoses     IFG (impaired fasting glucose)       Relevant Orders   Hemoglobin A1c   Vitamin D deficiency       Relevant Orders   Vitamin D (25 hydroxy)   Need for hepatitis C screening test  Relevant Orders   Hepatitis C Antibody   Internal hemorrhoid, bleeding       Relevant Orders   Ambulatory referral to Gastroenterology       Outpatient Encounter Medications as of 07/04/2021  Medication Sig   valACYclovir (VALTREX) 1000 MG tablet Take 1 tablet (1,000 mg total) by mouth 3 (three) times daily for 7 days.   cyclobenzaprine (FLEXERIL) 10 MG tablet Take 1 tablet (10 mg total) by mouth 2 (two) times daily as needed for muscle spasms. (Patient not taking: Reported on 07/04/2021)   fluticasone (FLONASE) 50 MCG/ACT nasal spray Place 2 sprays into both nostrils daily for 14 days.   ibuprofen (ADVIL) 800 MG tablet Take 1 tablet (800 mg total) by mouth every 8 (eight) hours as needed for moderate pain. (Patient not taking: Reported on 07/04/2021)   ipratropium (ATROVENT) 0.03 % nasal spray Place 2 sprays into both nostrils 2 (two) times daily. (Patient not taking: Reported on 07/04/2021)   levocetirizine (XYZAL) 5 MG tablet Take 1 tablet (5 mg total) by mouth every evening. (Patient not taking: Reported on 07/04/2021)   meloxicam (MOBIC) 15 MG tablet Take 1 tablet (15 mg total) by mouth daily. (Patient not taking: Reported on 07/04/2021)   methocarbamol (ROBAXIN) 500 MG tablet Take 1 tablet (500 mg total) by mouth 2 (two) times daily. (Patient not taking: Reported on 07/04/2021)   traMADol (ULTRAM) 50 MG tablet Take 1 tablet (50 mg total) by mouth every 6 (six) hours as needed. (Patient not taking: Reported on 07/04/2021)   No facility-administered encounter medications on file as of 07/04/2021.    Follow-up: Return in about 6 months (around 01/04/2022).    Alvira Monday, FNP

## 2021-07-04 NOTE — Patient Instructions (Addendum)
I appreciate the opportunity to provide care to you today!    Follow up:  6 months  Labs: please stop by the lab today to get your blood drawn (CBC, CMP, TSH, Lipid profile, HgA1c, Vit D)  Screening: Hep C    Please pick up your prescription at the pharmacy   Shingles is caused by varicella zoster virus (VZV), the same virus that causes chickenpox. After a person recovers from chickenpox, the virus stays dormant (inactive) in their body. The virus can reactivate later, causing shingles.  Most people who develop shingles have only one episode during their lifetime. However, you can have shingles more than once.  If you have shingles, direct contact with the fluid from your rash blisters can spread VZV to people who have never had chickenpox or never received the chickenpox vaccine. If they get infected, they will develop chickenpox, not shingles. They could then develop shingles later in life.  The risk of spreading VZV to others is low if you cover the shingles rash. People with shingles cannot spread the virus before their rash blisters appear or after the rash crusts.                    To prevent spreading VZV to others: -Cover the rash. -Avoid touching or scratching the rash. -Wash your hands often. -Avoid contact with the following people until your rash crusts: pregnant women who have never had chickenpox or the chickenpox vaccine;premature or low birth weight infants; and people with weakened immune systems, such as people receiving immunosuppressive medications or undergoing chemotherapy, organ transplant recipients, and people with human immunodeficiency virus (HIV) infection.     Please continue to a heart-healthy diet and increase your physical activities. Try to exercise for at least three times a week.      It was a pleasure to see you and I look forward to continuing to work together on your health and well-being. Please do not hesitate to call the office if  you need care or have questions about your care.   Have a wonderful day and week. With Gratitude, Gilmore Laroche MSN, FNP-BC

## 2021-07-04 NOTE — Assessment & Plan Note (Signed)
Assessment findings consistent with herpes zooster Will treat with valtrex for 7 days  Instructions provided to prevent spreading the rash To prevent spreading VZV to others: -Cover the rash. -Avoid touching or scratching the rash. -Wash your hands often. -Avoid contact with the following people until your rash crusts: pregnant women who have never had chickenpox or the chickenpox vaccine;premature or low birth weight infants; and people with weakened immune systems, such as people receiving immunosuppressive medications or undergoing chemotherapy, organ transplant recipients, and people with human immunodeficiency virus (HIV) infection.

## 2021-07-07 ENCOUNTER — Encounter (INDEPENDENT_AMBULATORY_CARE_PROVIDER_SITE_OTHER): Payer: Self-pay | Admitting: *Deleted

## 2021-07-07 DIAGNOSIS — R7301 Impaired fasting glucose: Secondary | ICD-10-CM | POA: Diagnosis not present

## 2021-07-07 DIAGNOSIS — E559 Vitamin D deficiency, unspecified: Secondary | ICD-10-CM | POA: Diagnosis not present

## 2021-07-07 DIAGNOSIS — B029 Zoster without complications: Secondary | ICD-10-CM | POA: Diagnosis not present

## 2021-07-07 DIAGNOSIS — Z1159 Encounter for screening for other viral diseases: Secondary | ICD-10-CM | POA: Diagnosis not present

## 2021-07-08 LAB — LIPID PANEL
Chol/HDL Ratio: 5.2 ratio — ABNORMAL HIGH (ref 0.0–5.0)
Cholesterol, Total: 203 mg/dL — ABNORMAL HIGH (ref 100–199)
HDL: 39 mg/dL — ABNORMAL LOW (ref >39)
LDL Chol Calc (NIH): 124 mg/dL — ABNORMAL HIGH (ref 0–99)
Triglycerides: 227 mg/dL — ABNORMAL HIGH (ref 0–149)
VLDL Cholesterol Cal: 40 mg/dL (ref 5–40)

## 2021-07-08 LAB — CMP14+EGFR
ALT: 79 IU/L — ABNORMAL HIGH (ref 0–44)
AST: 44 IU/L — ABNORMAL HIGH (ref 0–40)
Albumin/Globulin Ratio: 2 (ref 1.2–2.2)
Albumin: 4.7 g/dL (ref 4.1–5.2)
Alkaline Phosphatase: 85 IU/L (ref 44–121)
BUN/Creatinine Ratio: 15 (ref 9–20)
BUN: 12 mg/dL (ref 6–20)
Bilirubin Total: 0.7 mg/dL (ref 0.0–1.2)
CO2: 24 mmol/L (ref 20–29)
Calcium: 9.4 mg/dL (ref 8.7–10.2)
Chloride: 102 mmol/L (ref 96–106)
Creatinine, Ser: 0.79 mg/dL (ref 0.76–1.27)
Globulin, Total: 2.4 g/dL (ref 1.5–4.5)
Glucose: 96 mg/dL (ref 70–99)
Potassium: 4.4 mmol/L (ref 3.5–5.2)
Sodium: 140 mmol/L (ref 134–144)
Total Protein: 7.1 g/dL (ref 6.0–8.5)
eGFR: 123 mL/min/{1.73_m2} (ref >59)

## 2021-07-08 LAB — CBC WITH DIFFERENTIAL/PLATELET
Basophils Absolute: 0.1 10*3/uL (ref 0.0–0.2)
Basos: 1 %
EOS (ABSOLUTE): 0.3 10*3/uL (ref 0.0–0.4)
Eos: 3 %
Hematocrit: 48 % (ref 37.5–51.0)
Hemoglobin: 16.2 g/dL (ref 13.0–17.7)
Immature Grans (Abs): 0 10*3/uL (ref 0.0–0.1)
Immature Granulocytes: 0 %
Lymphocytes Absolute: 2.5 10*3/uL (ref 0.7–3.1)
Lymphs: 31 %
MCH: 30.7 pg (ref 26.6–33.0)
MCHC: 33.8 g/dL (ref 31.5–35.7)
MCV: 91 fL (ref 79–97)
Monocytes Absolute: 0.8 10*3/uL (ref 0.1–0.9)
Monocytes: 10 %
Neutrophils Absolute: 4.4 10*3/uL (ref 1.4–7.0)
Neutrophils: 55 %
Platelets: 241 10*3/uL (ref 150–450)
RBC: 5.28 x10E6/uL (ref 4.14–5.80)
RDW: 11.9 % (ref 11.6–15.4)
WBC: 8 10*3/uL (ref 3.4–10.8)

## 2021-07-08 LAB — VITAMIN D 25 HYDROXY (VIT D DEFICIENCY, FRACTURES): Vit D, 25-Hydroxy: 23 ng/mL — ABNORMAL LOW (ref 30.0–100.0)

## 2021-07-08 LAB — HEMOGLOBIN A1C
Est. average glucose Bld gHb Est-mCnc: 100 mg/dL
Hgb A1c MFr Bld: 5.1 % (ref 4.8–5.6)

## 2021-07-08 LAB — TSH+FREE T4
Free T4: 1.07 ng/dL (ref 0.82–1.77)
TSH: 1.53 u[IU]/mL (ref 0.450–4.500)

## 2021-07-08 LAB — HEPATITIS C ANTIBODY: Hep C Virus Ab: NONREACTIVE

## 2021-07-08 NOTE — Progress Notes (Signed)
Please advise the patient to limit his alcohol intake as his liver levels are elevated. His cholesterol levels are elevated. I recommend taking OTC fish oil 2000mg  BID. He can also take OTC Vit D 5000iu daily for his Vit D. It is slightly low.   Lifestyle changes include weight loss and exercising at least 3 days a week for 150 min weekly.  I also recommend a cholesterol-lowering diet low in fat or saturated fat and implementing the Mediterranean diet, which emphasizes fruits, vegetables, whole grains, beans, nuts, seeds, and healthy fats.

## 2021-07-09 DIAGNOSIS — S338XXA Sprain of other parts of lumbar spine and pelvis, initial encounter: Secondary | ICD-10-CM | POA: Diagnosis not present

## 2021-07-09 DIAGNOSIS — S233XXA Sprain of ligaments of thoracic spine, initial encounter: Secondary | ICD-10-CM | POA: Diagnosis not present

## 2021-07-09 DIAGNOSIS — S134XXA Sprain of ligaments of cervical spine, initial encounter: Secondary | ICD-10-CM | POA: Diagnosis not present

## 2021-07-15 ENCOUNTER — Other Ambulatory Visit: Payer: Self-pay | Admitting: Family Medicine

## 2021-07-15 ENCOUNTER — Encounter: Payer: Self-pay | Admitting: Family Medicine

## 2021-07-15 DIAGNOSIS — R21 Rash and other nonspecific skin eruption: Secondary | ICD-10-CM

## 2021-07-15 NOTE — Telephone Encounter (Signed)
Inform him that I've placed a referral to dermatology for further evaluation of the rash.

## 2021-07-23 DIAGNOSIS — S233XXA Sprain of ligaments of thoracic spine, initial encounter: Secondary | ICD-10-CM | POA: Diagnosis not present

## 2021-07-23 DIAGNOSIS — S338XXA Sprain of other parts of lumbar spine and pelvis, initial encounter: Secondary | ICD-10-CM | POA: Diagnosis not present

## 2021-07-23 DIAGNOSIS — S134XXA Sprain of ligaments of cervical spine, initial encounter: Secondary | ICD-10-CM | POA: Diagnosis not present

## 2021-08-04 DIAGNOSIS — S134XXA Sprain of ligaments of cervical spine, initial encounter: Secondary | ICD-10-CM | POA: Diagnosis not present

## 2021-08-04 DIAGNOSIS — S233XXA Sprain of ligaments of thoracic spine, initial encounter: Secondary | ICD-10-CM | POA: Diagnosis not present

## 2021-08-04 DIAGNOSIS — S338XXA Sprain of other parts of lumbar spine and pelvis, initial encounter: Secondary | ICD-10-CM | POA: Diagnosis not present

## 2021-08-05 ENCOUNTER — Telehealth: Payer: 59 | Admitting: Physician Assistant

## 2021-08-05 DIAGNOSIS — M549 Dorsalgia, unspecified: Secondary | ICD-10-CM | POA: Diagnosis not present

## 2021-08-05 MED ORDER — CYCLOBENZAPRINE HCL 10 MG PO TABS: 10.0000 mg | ORAL_TABLET | Freq: Three times a day (TID) | ORAL | 0 refills | Status: DC | PRN

## 2021-08-05 NOTE — Progress Notes (Signed)
I have spent 5 minutes in review of e-visit questionnaire, review and updating patient chart, medical decision making and response to patient.   Brynlynn Walko Cody Ildefonso Keaney, PA-C    

## 2021-08-05 NOTE — Progress Notes (Signed)

## 2021-08-25 ENCOUNTER — Encounter (INDEPENDENT_AMBULATORY_CARE_PROVIDER_SITE_OTHER): Payer: Self-pay | Admitting: Gastroenterology

## 2021-08-25 ENCOUNTER — Ambulatory Visit (INDEPENDENT_AMBULATORY_CARE_PROVIDER_SITE_OTHER): Payer: 59 | Admitting: Gastroenterology

## 2021-08-25 DIAGNOSIS — S338XXA Sprain of other parts of lumbar spine and pelvis, initial encounter: Secondary | ICD-10-CM | POA: Diagnosis not present

## 2021-08-25 DIAGNOSIS — S134XXA Sprain of ligaments of cervical spine, initial encounter: Secondary | ICD-10-CM | POA: Diagnosis not present

## 2021-08-25 DIAGNOSIS — S233XXA Sprain of ligaments of thoracic spine, initial encounter: Secondary | ICD-10-CM | POA: Diagnosis not present

## 2021-08-29 ENCOUNTER — Ambulatory Visit
Admission: EM | Admit: 2021-08-29 | Discharge: 2021-08-29 | Disposition: A | Discharge status: Home / Self Care | Payer: 59

## 2021-08-29 DIAGNOSIS — R21 Rash and other nonspecific skin eruption: Secondary | ICD-10-CM

## 2021-08-29 MED ORDER — METHYLPREDNISOLONE SODIUM SUCC 125 MG IJ SOLR
60.0000 mg | Freq: Once | INTRAMUSCULAR | Status: AC
  Administered 2021-08-29: 60 mg via INTRAMUSCULAR

## 2021-08-29 MED ORDER — TRIAMCINOLONE ACETONIDE 0.1 % EX OINT: 1.0000 | TOPICAL_OINTMENT | Freq: Two times a day (BID) | CUTANEOUS | 0 refills | Status: DC

## 2021-08-29 MED ORDER — PREDNISONE 10 MG (21) PO TBPK: ORAL_TABLET | ORAL | 0 refills | Status: AC

## 2021-08-29 NOTE — ED Triage Notes (Signed)
Pt reports itching rasg in right wrist, right sided torso and back x 14 months on and off, flare up every 6 weeks. Pt reports he was told it was shingles 2 months ago. Acyclovir gives relief to the rash. Hydrocortisone gives no relief.

## 2021-08-29 NOTE — ED Provider Notes (Signed)
[facility]    CSN: [id number] Arrival date & time: 08/29/21  1136      History   Chief Complaint Chief Complaint  Patient presents with   Rash    HPI Webster Patrone is a 31 y.o. male.   Patient presents with rash to chest, bilateral back, bilateral wrists that has been ongoing for the past couple of days.  Reports that the same rash has waxed and waned for the past 14 months.  Reports the rash is itchy, red, and burns/bruises if he scratches at it too much.  He denies any fevers, nausea/vomiting, shortness of breath, throat or tongue swelling, new muscle pain or joint aches.  He also denies any recent change in his detergents, soaps, or personal care products.  He was previously treated with acyclovir for herpes zoster, has also used over-the-counter Benadryl cream, Bactroban, and hydrocortisone cream over-the-counter without relief.  Patient reports history of poison ivy/oak/sumac.  Reports he "knows" that this is not the same.  He has been referred to a dermatologist-visit is in October.     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 No pertinent surgical history.     Home Medications    Prior to Admission medications   Medication Sig Start Date End Date Taking? Authorizing Provider  Omega-3 Fatty Acids (FISH OIL) 1200 MG CAPS Take by mouth.   Yes [provider]  predniSONE (STERAPRED UNI-PAK 21 TAB) 10 MG (21) TBPK tablet Take 6 tablets (60 mg total) by mouth daily for 1 day, THEN 5 tablets (50 mg total) daily for 1 day, THEN 4 tablets (40 mg total) daily for 1 day, THEN 3 tablets (30 mg total) daily for 1 day, THEN 2 tablets  (20 mg total) daily for 1 day, THEN 1 tablet (10 mg total) daily for 1 day. 08/29/21 09/04/21 Yes Cathlean Marseilles A, NP  triamcinolone ointment (KENALOG) 0.1 % Apply 1 Application topically 2 (two) times daily. Apply sparingly to affected areas for no longer than 14 days consecutively 08/29/21  Yes Cathlean Marseilles A, NP  VITAMIN D PO Take by mouth.   Yes [provider]  cyclobenzaprine (FLEXERIL) 10 MG tablet Take 1 tablet (10 mg total) by mouth 3 (three) times daily as needed for muscle spasms. 08/05/21   Waldon Merl, PA-C  fluticasone (FLONASE) 50 MCG/ACT nasal spray Place 2 sprays into both nostrils daily for 14 days. 02/04/20 02/18/20  Leath-Warren, Sadie Haber, NP  ibuprofen (ADVIL) 800 MG tablet Take 1 tablet (800 mg total) by mouth every 8 (eight) hours as needed for moderate pain. Patient not taking: Reported on 07/04/2021 05/17/20   Moshe Cipro, NP  ipratropium (ATROVENT) 0.03 % nasal spray Place 2 sprays into both nostrils 2 (two) times daily. Patient not taking: Reported on 07/04/2021 02/20/19   Bing Neighbors, FNP  levocetirizine (XYZAL) 5 MG tablet Take 1 tablet (5 mg total) by mouth every evening. Patient not taking: Reported on 07/04/2021 02/20/19   Bing Neighbors, FNP  meloxicam (MOBIC) 15 MG tablet Take 1 tablet (15 mg total) by mouth daily. Patient not taking: Reported on 07/04/2021  10/30/19   Lurene Shadow, PA-C  methocarbamol (ROBAXIN) 500 MG tablet Take 1 tablet (500 mg total) by mouth 2 (two) times daily. Patient not taking: Reported on 07/04/2021 10/30/19   Lurene Shadow, PA-C  traMADol (ULTRAM) 50 MG tablet Take 1 tablet (50 mg total) by mouth every 6 (six) hours as needed. Patient not taking: Reported on 07/04/2021 05/17/20   Moshe Cipro, NP    Family History Family History  Problem Relation Age of Onset   Hypertension Father    Asthma Neg Hx     Social History Social History   Tobacco Use   Smoking status: Never   Smokeless tobacco:  Never  Vaping Use   Vaping Use: Every day   Substances: Nicotine, Flavoring  Substance Use Topics   Alcohol use: Yes    Alcohol/week: 1.0 - 8.0 standard drink of alcohol    Types: 1 - 8 Standard drinks or equivalent per week   Drug use: No     Allergies   Amoxicillin, Ginger, and Penicillins   Review of Systems Review of Systems Per HPI  Physical Exam Triage Vital Signs ED Triage Vitals [08/29/21 1147]  Enc Vitals Group     BP (!) 142/85     Pulse Rate 81     Resp 18     Temp 98 F (36.7 C)     Temp Source Oral     SpO2 98 %     Weight      Height      Head Circumference      Peak Flow      Pain Score 0     Pain Loc      Pain Edu?      Excl. in GC?    No data found.  Updated Vital Signs BP (!) 142/85 (BP Location: Right Arm)   Pulse 81   Temp 98 F (36.7 C) (Oral)   Resp 18   SpO2 98%   Visual Acuity Right Eye Distance:   Left Eye Distance:   Bilateral Distance:    Right Eye Near:   Left Eye Near:    Bilateral Near:     Physical Exam Vitals and nursing note reviewed.  Constitutional:      General: He is not in acute distress.    Appearance: Normal appearance. He is obese. He is not toxic-appearing.  HENT:     Mouth/Throat:     Mouth: Mucous membranes are moist.     Pharynx: Oropharynx is clear.  Pulmonary:     Effort: Pulmonary effort is normal. No respiratory distress.  Skin:    General: Skin is warm and dry.     Capillary Refill: Capillary refill takes less than 2 seconds.     Findings: Erythema and rash present. Rash is macular, papular, urticarial and vesicular.       Neurological:     Mental Status: He is alert and oriented to person, place, and time.  Psychiatric:        Behavior: Behavior is cooperative.      UC Treatments / Results  Labs (all labs ordered are listed, but only abnormal results are displayed) Labs Reviewed - No data to display  EKG   Radiology No results found.  Procedures Procedures (including  critical care time)  Medications Ordered in UC Medications  methylPREDNISolone sodium succinate (SOLU-MEDROL) 125 mg/2 mL injection 60 mg (60 mg Intramuscular Given 08/29/21 1208)    Initial Impression / Assessment and Plan /  UC Course  I have reviewed the triage vital signs and the nursing notes.  Pertinent labs & imaging results that were available during my care of the patient were reviewed by me and considered in my medical decision making (see chart for details).    Patient is a well-appearing 88 male presenting for rash.  Suspect contact dermatitis.  Treat with IM Solu-Medrol today in urgent care.  Tomorrow, start prednisone taper.  Can also use corticosteroid ointment twice daily to itchy areas to prevent itch as well as Benadryl at nighttime.  Encouraged follow-up with PCP or dermatology if symptoms persist or worsen despite treatment.  The patient was given the opportunity to ask questions.  All questions answered to their satisfaction.  The patient is in agreement to this plan.   Final Clinical Impressions(s) / UC Diagnoses   Final diagnoses:  Rash and nonspecific skin eruption     Discharge Instructions      - The rash appears to be some sort of contact dermatitis - We have given you a steroid shot today to help with inflammation/itching - Start prednisone tomorrow and take as prescribed; you can use topical ointment twice daily for no longer than 2 weeks - Take Bendaryl 25 - 50 mg at night time to help with itch - Follow up with Dermatology if symptoms not improved with the treatment   ED Prescriptions     Medication Sig Dispense Auth. Provider   predniSONE (STERAPRED UNI-PAK 21 TAB) 10 MG (21) TBPK tablet Take 6 tablets (60 mg total) by mouth daily for 1 day, THEN 5 tablets (50 mg total) daily for 1 day, THEN 4 tablets (40 mg total) daily for 1 day, THEN 3 tablets (30 mg total) daily for 1 day, THEN 2 tablets (20 mg total) daily for 1 day, THEN 1 tablet (10 mg total)  daily for 1 day. 21 each Cathlean Marseilles A, NP   triamcinolone ointment (KENALOG) 0.1 % Apply 1 Application topically 2 (two) times daily. Apply sparingly to affected areas for no longer than 14 days consecutively 30 g Valentino Nose, NP      PDMP not reviewed this encounter.   Valentino Nose, NP 08/29/21 [phone]

## 2021-08-29 NOTE — Discharge Instructions (Signed)
-   The rash appears to be some sort of contact dermatitis - We have given you a steroid shot today to help with inflammation/itching - Start prednisone tomorrow and take as prescribed; you can use topical ointment twice daily for no longer than 2 weeks - Take Bendaryl 25 - 50 mg at night time to help with itch - Follow up with Dermatology if symptoms not improved with the treatment

## 2021-09-22 ENCOUNTER — Ambulatory Visit (INDEPENDENT_AMBULATORY_CARE_PROVIDER_SITE_OTHER): Payer: 59 | Admitting: Gastroenterology

## 2021-09-22 ENCOUNTER — Encounter (INDEPENDENT_AMBULATORY_CARE_PROVIDER_SITE_OTHER): Payer: Self-pay | Admitting: Gastroenterology

## 2021-10-06 ENCOUNTER — Encounter (INDEPENDENT_AMBULATORY_CARE_PROVIDER_SITE_OTHER): Payer: Self-pay

## 2021-10-06 ENCOUNTER — Telehealth (INDEPENDENT_AMBULATORY_CARE_PROVIDER_SITE_OTHER): Payer: Self-pay

## 2021-10-06 ENCOUNTER — Other Ambulatory Visit (INDEPENDENT_AMBULATORY_CARE_PROVIDER_SITE_OTHER): Payer: Self-pay

## 2021-10-06 ENCOUNTER — Ambulatory Visit (INDEPENDENT_AMBULATORY_CARE_PROVIDER_SITE_OTHER): Payer: 59 | Admitting: Gastroenterology

## 2021-10-06 ENCOUNTER — Encounter (INDEPENDENT_AMBULATORY_CARE_PROVIDER_SITE_OTHER): Payer: Self-pay | Admitting: Gastroenterology

## 2021-10-06 DIAGNOSIS — K625 Hemorrhage of anus and rectum: Secondary | ICD-10-CM | POA: Diagnosis not present

## 2021-10-06 MED ORDER — PEG 3350-KCL-NA BICARB-NACL 420 G PO SOLR: 4000.0000 mL | ORAL | 0 refills | Status: DC

## 2021-10-06 NOTE — Progress Notes (Signed)
Katrinka Blazing, M.D. Gastroenterology & Hepatology [facility] [address] [place], [place] [address] Primary Care Physician: Gilmore Laroche, FNP [address] [place] [address]  Referring MD: PCP  Chief Complaint:  Rectal bleeding  History of Present Illness: Justin Jordan is a 31 y.o. male who presents for evaluation of rectal bleeding.  Patient reports that for the last year and a half he has presented recurrent episodes of tissue hematochezia, with some episodes of scant blood when he has to strain to move his bowels. He states his bowels are hard occasionally, but sometimes they have normal consistency and he still ahs blood. He has 2-3 Bms per day. He feels some itching in the anal area intermittently, but he may have some burning sensation when having a BM intermittently.  The patient denies having any nausea, vomiting, fever, chills, melena, hematemesis, abdominal distention, abdominal pain, diarrhea, jaundice, pruritus or weight loss.  Last YHC:WCBJS Last Colonoscopy:never  FHx: neg for any gastrointestinal/liver disease, possible cancer in grandmother, aunts and uncles Social: vapes frequently, drinks 2-3 beers every day rarely drinks liquor, neg cig smoking or illicit drug use Surgical: no abdominal surgeries  Past Medical History: Past Medical History:  Diagnosis Date   Allergy    Anxiety    Chronic sinus infection    Depression     Past Surgical History:History reviewed. No pertinent surgical history.  Family History: Family History  Problem Relation Age of Onset   Hypertension Father    Asthma Neg Hx     Social History: Social History   Tobacco Use  Smoking Status Never  Smokeless Tobacco Never   Social History   Substance and Sexual Activity  Alcohol Use Yes   Alcohol/week: 1.0 - 8.0 standard drink of alcohol   Types: 1 - 8 Standard drinks or equivalent per week   Comment: 2-3  beers Qhs   Social History   Substance and Sexual Activity  Drug Use No    Allergies: Allergies  Allergen Reactions   Amoxicillin Hives   Ginger    Penicillins Rash    Medications: Current Outpatient Medications  Medication Sig Dispense Refill   Omega-3 Fatty Acids (FISH OIL) 1200 MG CAPS Take by mouth.     triamcinolone ointment (KENALOG) 0.1 % Apply 1 Application topically 2 (two) times daily. Apply sparingly to affected areas for no longer than 14 days consecutively 30 g 0   VITAMIN D PO Take by mouth.     No current facility-administered medications for this visit.    Review of Systems: GENERAL: negative for malaise, night sweats HEENT: No changes in hearing or vision, no nose bleeds or other nasal problems. NECK: Negative for lumps, goiter, pain and significant neck swelling RESPIRATORY: Negative for cough, wheezing CARDIOVASCULAR: Negative for chest pain, leg swelling, palpitations, orthopnea GI: SEE HPI MUSCULOSKELETAL: Negative for joint pain or swelling, back pain, and muscle pain. SKIN: Negative for lesions, rash PSYCH: Negative for sleep disturbance, mood disorder and recent psychosocial stressors. HEMATOLOGY Negative for prolonged bleeding, bruising easily, and swollen nodes. ENDOCRINE: Negative for cold or heat intolerance, polyuria, polydipsia and goiter. NEURO: negative for tremor, gait imbalance, syncope and seizures. The remainder of the review of systems is noncontributory.   Physical Exam: BP 122/81 (BP Location: Left Arm, Patient Position: Sitting, Cuff Size: Large)   Pulse 67   Temp 98 F (36.7 C) (Oral)   Ht 6' (1.829 m)   Wt 263 lb 1.6 oz (119.3  kg)   BMI 35.68 kg/m  GENERAL: The patient is AO x3, in no acute distress. HEENT: Head is normocephalic and atraumatic. EOMI are intact. Mouth is well hydrated and without lesions. NECK: Supple. No masses LUNGS: Clear to auscultation. No presence of rhonchi/wheezing/rales. Adequate chest  expansion HEART: RRR, normal s1 and s2. ABDOMEN: Soft, nontender, no guarding, no peritoneal signs, and nondistended. BS +. No masses. RECTAL EXAM: presence of anal tags at 12 position, normal tone, no masses, brown stool without blood. Chaperone: Crystal Sutton, CMA EXTREMITIES: Without any cyanosis, clubbing, rash, lesions or edema. NEUROLOGIC: AOx3, no focal motor deficit. SKIN: no jaundice, no rashes   Imaging/Labs: as above  I personally reviewed and interpreted the available labs, imaging and endoscopic files.  Impression and Plan: Justin Jordan is a 31 y.o. male who presents for evaluation of rectal bleeding. He is presenting mostly tissue hematochezia without red flag signs or other risk factors for malignancy.  Given the findings on rectal exam, this is likely related to internal hemorrhoids or hard stools causing some mild tear in the anal area,  I recommended taking Miralax daily which may help soften his stools. However, we will evaluate for other etiologies with a colonoscopy.  -Schedule colonoscopy -Start taking Miralax 1/2 capful every day  All questions were answered.      Cate Oravec Castaneda, MD Gastroenterology and Hepatology [facility]   

## 2021-10-06 NOTE — Patient Instructions (Signed)
Schedule colonoscopy Start taking Miralax 1/2 capful every day

## 2021-10-06 NOTE — H&P (View-Only) (Signed)
Katrinka Blazing, M.D. Gastroenterology & Hepatology [facility] [address] [place], [place] [address] Primary Care Physician: Gilmore Laroche, FNP [address] [place] [address]  Referring MD: PCP  Chief Complaint:  Rectal bleeding  History of Present Illness: Wayne Wicklund is a 31 y.o. male who presents for evaluation of rectal bleeding.  Patient reports that for the last year and a half he has presented recurrent episodes of tissue hematochezia, with some episodes of scant blood when he has to strain to move his bowels. He states his bowels are hard occasionally, but sometimes they have normal consistency and he still ahs blood. He has 2-3 Bms per day. He feels some itching in the anal area intermittently, but he may have some burning sensation when having a BM intermittently.  The patient denies having any nausea, vomiting, fever, chills, melena, hematemesis, abdominal distention, abdominal pain, diarrhea, jaundice, pruritus or weight loss.  Last YHC:WCBJS Last Colonoscopy:never  FHx: neg for any gastrointestinal/liver disease, possible cancer in grandmother, aunts and uncles Social: vapes frequently, drinks 2-3 beers every day rarely drinks liquor, neg cig smoking or illicit drug use Surgical: no abdominal surgeries  Past Medical History: Past Medical History:  Diagnosis Date   Allergy    Anxiety    Chronic sinus infection    Depression     Past Surgical History:History reviewed. No pertinent surgical history.  Family History: Family History  Problem Relation Age of Onset   Hypertension Father    Asthma Neg Hx     Social History: Social History   Tobacco Use  Smoking Status Never  Smokeless Tobacco Never   Social History   Substance and Sexual Activity  Alcohol Use Yes   Alcohol/week: 1.0 - 8.0 standard drink of alcohol   Types: 1 - 8 Standard drinks or equivalent per week   Comment: 2-3  beers Qhs   Social History   Substance and Sexual Activity  Drug Use No    Allergies: Allergies  Allergen Reactions   Amoxicillin Hives   Ginger    Penicillins Rash    Medications: Current Outpatient Medications  Medication Sig Dispense Refill   Omega-3 Fatty Acids (FISH OIL) 1200 MG CAPS Take by mouth.     triamcinolone ointment (KENALOG) 0.1 % Apply 1 Application topically 2 (two) times daily. Apply sparingly to affected areas for no longer than 14 days consecutively 30 g 0   VITAMIN D PO Take by mouth.     No current facility-administered medications for this visit.    Review of Systems: GENERAL: negative for malaise, night sweats HEENT: No changes in hearing or vision, no nose bleeds or other nasal problems. NECK: Negative for lumps, goiter, pain and significant neck swelling RESPIRATORY: Negative for cough, wheezing CARDIOVASCULAR: Negative for chest pain, leg swelling, palpitations, orthopnea GI: SEE HPI MUSCULOSKELETAL: Negative for joint pain or swelling, back pain, and muscle pain. SKIN: Negative for lesions, rash PSYCH: Negative for sleep disturbance, mood disorder and recent psychosocial stressors. HEMATOLOGY Negative for prolonged bleeding, bruising easily, and swollen nodes. ENDOCRINE: Negative for cold or heat intolerance, polyuria, polydipsia and goiter. NEURO: negative for tremor, gait imbalance, syncope and seizures. The remainder of the review of systems is noncontributory.   Physical Exam: BP 122/81 (BP Location: Left Arm, Patient Position: Sitting, Cuff Size: Large)   Pulse 67   Temp 98 F (36.7 C) (Oral)   Ht 6' (1.829 m)   Wt 263 lb 1.6 oz (119.3  kg)   BMI 35.68 kg/m  GENERAL: The patient is AO x3, in no acute distress. HEENT: Head is normocephalic and atraumatic. EOMI are intact. Mouth is well hydrated and without lesions. NECK: Supple. No masses LUNGS: Clear to auscultation. No presence of rhonchi/wheezing/rales. Adequate chest  expansion HEART: RRR, normal s1 and s2. ABDOMEN: Soft, nontender, no guarding, no peritoneal signs, and nondistended. BS +. No masses. RECTAL EXAM: presence of anal tags at 12 position, normal tone, no masses, brown stool without blood. Chaperone: Joanette Gula, CMA EXTREMITIES: Without any cyanosis, clubbing, rash, lesions or edema. NEUROLOGIC: AOx3, no focal motor deficit. SKIN: no jaundice, no rashes   Imaging/Labs: as above  I personally reviewed and interpreted the available labs, imaging and endoscopic files.  Impression and Plan: Osmel Dykstra is a 31 y.o. male who presents for evaluation of rectal bleeding. He is presenting mostly tissue hematochezia without red flag signs or other risk factors for malignancy.  Given the findings on rectal exam, this is likely related to internal hemorrhoids or hard stools causing some mild tear in the anal area,  I recommended taking Miralax daily which may help soften his stools. However, we will evaluate for other etiologies with a colonoscopy.  -Schedule colonoscopy -Start taking Miralax 1/2 capful every day  All questions were answered.      Katrinka Blazing, MD Gastroenterology and Hepatology [facility]

## 2021-10-06 NOTE — Telephone Encounter (Signed)
Gabriela Irigoyen Ann Makiah Foye, CMA  ?

## 2021-10-21 ENCOUNTER — Ambulatory Visit (INDEPENDENT_AMBULATORY_CARE_PROVIDER_SITE_OTHER): Payer: 59 | Admitting: Gastroenterology

## 2021-10-29 ENCOUNTER — Other Ambulatory Visit: Payer: Self-pay

## 2021-10-29 ENCOUNTER — Ambulatory Visit (HOSPITAL_BASED_OUTPATIENT_CLINIC_OR_DEPARTMENT_OTHER): Payer: 59 | Admitting: Anesthesiology

## 2021-10-29 ENCOUNTER — Ambulatory Visit (HOSPITAL_COMMUNITY): Payer: 59 | Admitting: Anesthesiology

## 2021-10-29 ENCOUNTER — Ambulatory Visit (HOSPITAL_COMMUNITY)
Admission: RE | Admit: 2021-10-29 | Discharge: 2021-10-29 | Disposition: A | Discharge status: Home / Self Care | Payer: 59 | Source: Ambulatory Visit | Attending: Gastroenterology | Admitting: Gastroenterology

## 2021-10-29 ENCOUNTER — Encounter (INDEPENDENT_AMBULATORY_CARE_PROVIDER_SITE_OTHER): Payer: Self-pay | Admitting: *Deleted

## 2021-10-29 ENCOUNTER — Encounter (HOSPITAL_COMMUNITY)
Admission: RE | Disposition: A | Discharge status: Home / Self Care | Payer: Self-pay | Source: Ambulatory Visit | Attending: Gastroenterology

## 2021-10-29 ENCOUNTER — Encounter (HOSPITAL_COMMUNITY): Payer: Self-pay | Admitting: Gastroenterology

## 2021-10-29 DIAGNOSIS — J45909 Unspecified asthma, uncomplicated: Secondary | ICD-10-CM | POA: Insufficient documentation

## 2021-10-29 DIAGNOSIS — K635 Polyp of colon: Secondary | ICD-10-CM

## 2021-10-29 DIAGNOSIS — R69 Illness, unspecified: Secondary | ICD-10-CM | POA: Diagnosis not present

## 2021-10-29 DIAGNOSIS — K648 Other hemorrhoids: Secondary | ICD-10-CM

## 2021-10-29 DIAGNOSIS — K625 Hemorrhage of anus and rectum: Secondary | ICD-10-CM | POA: Insufficient documentation

## 2021-10-29 DIAGNOSIS — D124 Benign neoplasm of descending colon: Secondary | ICD-10-CM | POA: Diagnosis not present

## 2021-10-29 DIAGNOSIS — F418 Other specified anxiety disorders: Secondary | ICD-10-CM | POA: Insufficient documentation

## 2021-10-29 DIAGNOSIS — K644 Residual hemorrhoidal skin tags: Secondary | ICD-10-CM | POA: Insufficient documentation

## 2021-10-29 HISTORY — PX: POLYPECTOMY: SHX5525

## 2021-10-29 HISTORY — PX: COLONOSCOPY WITH PROPOFOL: SHX5780

## 2021-10-29 LAB — HM COLONOSCOPY

## 2021-10-29 SURGERY — COLONOSCOPY WITH PROPOFOL
Anesthesia: General

## 2021-10-29 MED ORDER — LACTATED RINGERS IV SOLN: INTRAVENOUS | Status: DC

## 2021-10-29 MED ORDER — PROPOFOL 10 MG/ML IV BOLUS
INTRAVENOUS | Status: DC | PRN
  Administered 2021-10-29: 150 mg via INTRAVENOUS

## 2021-10-29 MED ORDER — PHENYLEPHRINE 80 MCG/ML (10ML) SYRINGE FOR IV PUSH (FOR BLOOD PRESSURE SUPPORT)
PREFILLED_SYRINGE | INTRAVENOUS | Status: DC | PRN
  Administered 2021-10-29: 160 ug via INTRAVENOUS

## 2021-10-29 MED ORDER — LIDOCAINE HCL (CARDIAC) PF 100 MG/5ML IV SOSY
PREFILLED_SYRINGE | INTRAVENOUS | Status: DC | PRN
  Administered 2021-10-29: 50 mg via INTRATRACHEAL

## 2021-10-29 MED ORDER — PROPOFOL 500 MG/50ML IV EMUL
INTRAVENOUS | Status: DC | PRN
  Administered 2021-10-29: 250 ug/kg/min via INTRAVENOUS

## 2021-10-29 NOTE — Anesthesia Preprocedure Evaluation (Signed)
Anesthesia Evaluation  Patient identified by MRN, date of birth, ID band Patient awake    Reviewed: Allergy & Precautions, NPO status , Patient's Chart, lab work & pertinent test results  Airway Mallampati: II  TM Distance: >3 FB Neck ROM: Full    Dental  (+) Dental Advisory Given, Chipped,    Pulmonary asthma ,    Pulmonary exam normal breath sounds clear to auscultation       Cardiovascular negative cardio ROS Normal cardiovascular exam Rhythm:Regular Rate:Normal     Neuro/Psych PSYCHIATRIC DISORDERS Anxiety Depression negative neurological ROS     GI/Hepatic negative GI ROS, Neg liver ROS,   Endo/Other  negative endocrine ROS  Renal/GU negative Renal ROS  negative genitourinary   Musculoskeletal negative musculoskeletal ROS (+)   Abdominal   Peds negative pediatric ROS (+)  Hematology negative hematology ROS (+)   Anesthesia Other Findings   Reproductive/Obstetrics negative OB ROS                             Anesthesia Physical Anesthesia Plan  ASA: 2  Anesthesia Plan: General   Post-op Pain Management: Minimal or no pain anticipated   Induction: Intravenous  PONV Risk Score and Plan: Propofol infusion  Airway Management Planned: Nasal Cannula and Natural Airway  Additional Equipment:   Intra-op Plan:   Post-operative Plan:   Informed Consent: I have reviewed the patients History and Physical, chart, labs and discussed the procedure including the risks, benefits and alternatives for the proposed anesthesia with the patient or authorized representative who has indicated his/her understanding and acceptance.     Dental advisory given  Plan Discussed with: CRNA and Surgeon  Anesthesia Plan Comments:         Anesthesia Quick Evaluation

## 2021-10-29 NOTE — Op Note (Addendum)
[facility] Patient Name: Justin Jordan Procedure Date: 10/29/2021 11:22 AM MRN: [id number] Date of Birth: Jul 17, 1990 Attending MD: Katrinka Blazing ,  CSN: [id number] Age: 31 Admit Type: Outpatient Procedure:                Colonoscopy Indications:              Rectal bleeding Providers:                Katrinka Blazing, Enzo Montgomery RN, RN, Hinton Rao Referring MD:              Medicines:                Monitored Anesthesia Care Complications:            No immediate complications. Estimated Blood Loss:     Estimated blood loss: none. Procedure:                Pre-Anesthesia Assessment:                           - Prior to the procedure, a History and Physical                            was performed, and patient medications, allergies                            and sensitivities were reviewed. The patient's                            tolerance of previous anesthesia was reviewed.                           - The risks and benefits of the procedure and the                            sedation options and risks were discussed with the                            patient. All questions were answered and informed                            consent was obtained.                           - ASA Grade Assessment: I - A normal, healthy                            patient.                           After obtaining informed consent, the colonoscope                            was passed under direct vision. Throughout the  procedure, the patient's blood pressure, pulse, and                            oxygen saturations were monitored continuously. The                            [id number]-HQ190L ([id number]) scope was introduced through                            the anus and advanced to the the terminal ileum.                            The colonoscopy was performed without difficulty.                            The patient tolerated the  procedure well. The                            quality of the bowel preparation was excellent. Scope In: 11:35:25 AM Scope Out: 11:55:52 AM Scope Withdrawal Time: 0 hours 14 minutes 46 seconds  Total Procedure Duration: 0 hours 20 minutes 27 seconds  Findings:      Hemorrhoids were found on perianal exam.      The terminal ileum appeared normal.      A 4 mm polyp was found in the descending colon. The polyp was sessile.       The polyp was removed with a cold snare. Resection and retrieval were       complete.      Non-bleeding external and internal hemorrhoids were found during       retroflexion. The hemorrhoids were small. Impression:               - Hemorrhoids found on perianal exam.                           - The examined portion of the ileum was normal.                           - One 4 mm polyp in the descending colon, removed                            with a cold snare. Resected and retrieved.                           - Non-bleeding external and internal hemorrhoids. Moderate Sedation:      Per Anesthesia Care Recommendation:           - Discharge patient to home (ambulatory).                           - Resume previous diet.                           - Await pathology results.                           -  Repeat colonoscopy for surveillance based on                            pathology results.                           - Take Miralax 1/2 capful every day Procedure Code(s):        --- Professional ---                           [phone], Colonoscopy, flexible; with removal of                            tumor(s), polyp(s), or other lesion(s) by snare                            technique Diagnosis Code(s):        --- Professional ---                           K64.8, Other hemorrhoids                           K63.5, Polyp of colon                           K62.5, Hemorrhage of anus and rectum CPT copyright 2019 [employer]. All rights reserved. The codes  documented in this report are preliminary and upon coder review may  be revised to meet current compliance requirements. Katrinka Blazing, MD Katrinka Blazing,  10/29/2021 12:02:31 PM This report has been signed electronically. Number of Addenda: 0

## 2021-10-29 NOTE — Interval H&P Note (Signed)
History and Physical Interval Note:  10/29/2021 10:20 AM  Justin Jordan  has presented today for surgery, with the diagnosis of Rectal Bleeding.  The various methods of treatment have been discussed with the patient and family. After consideration of risks, benefits and other options for treatment, the patient has consented to  Procedure(s) with comments: COLONOSCOPY WITH PROPOFOL (N/A) - 1130 ASA 1 as a surgical intervention.  The patient's history has been reviewed, patient examined, no change in status, stable for surgery.  I have reviewed the patient's chart and labs.  Questions were answered to the patient's satisfaction.     Katrinka Blazing Mayorga

## 2021-10-29 NOTE — Discharge Instructions (Signed)
You are being discharged to home.  Resume your previous diet.  We are waiting for your pathology results.  Your physician has recommended a repeat colonoscopy for surveillance based on pathology results.  Take Miralax 1/2 capful every day

## 2021-10-29 NOTE — Anesthesia Postprocedure Evaluation (Signed)
Anesthesia Post Note  Patient: Justin Jordan  Procedure(s) Performed: COLONOSCOPY WITH PROPOFOL POLYPECTOMY  Patient location during evaluation: Phase II Anesthesia Type: General Level of consciousness: awake and alert and oriented Pain management: pain level controlled Vital Signs Assessment: post-procedure vital signs reviewed and stable Respiratory status: spontaneous breathing, nonlabored ventilation and respiratory function stable Cardiovascular status: blood pressure returned to baseline and stable Postop Assessment: no apparent nausea or vomiting Anesthetic complications: no   No notable events documented.   Last Vitals:  Vitals:   10/29/21 1027 10/29/21 1200  BP: 136/79 (!) 93/44  Pulse: 63 62  Resp: 19 16  Temp: 36.7 C 36.4 C  SpO2: 98% 97%    Last Pain:  Vitals:   10/29/21 1200  TempSrc: Oral  PainSc: 0-No pain                 Angeles Paolucci C Rohith Fauth

## 2021-10-29 NOTE — Transfer of Care (Signed)
Immediate Anesthesia Transfer of Care Note  Patient: Justin Jordan  Procedure(s) Performed: COLONOSCOPY WITH PROPOFOL POLYPECTOMY  Patient Location: Endoscopy Unit  Anesthesia Type:General  Level of Consciousness: awake, alert , oriented and patient cooperative  Airway & Oxygen Therapy: Patient Spontanous Breathing  Post-op Assessment: Report given to RN, Post -op Vital signs reviewed and stable and Patient moving all extremities  Post vital signs: Reviewed and stable  Last Vitals:  Vitals Value Taken Time  BP 93/44 10/29/21 1200  Temp 36.4 C 10/29/21 1200  Pulse 62 10/29/21 1200  Resp 16 10/29/21 1200  SpO2 97 % 10/29/21 1200    Last Pain:  Vitals:   10/29/21 1200  TempSrc: Oral  PainSc: 0-No pain      Patients Stated Pain Goal: 7 (10/29/21 1017)  Complications: No notable events documented.

## 2021-10-30 LAB — SURGICAL PATHOLOGY

## 2021-11-04 ENCOUNTER — Encounter (HOSPITAL_COMMUNITY): Payer: Self-pay | Admitting: Gastroenterology

## 2021-11-21 DIAGNOSIS — L2089 Other atopic dermatitis: Secondary | ICD-10-CM | POA: Diagnosis not present

## 2021-12-08 ENCOUNTER — Other Ambulatory Visit: Payer: Self-pay | Admitting: Family Medicine

## 2021-12-31 IMAGING — DX DG CERVICAL SPINE COMPLETE 4+V
6 series · 6 of 6 positions shown · non-contrast
Comparison: None.

CLINICAL DATA: Left cervical pain for 4 days, no known injury,
remote injury approximately 10 years prior

EXAM:
CERVICAL SPINE - COMPLETE 4+ VIEW

[c-spine lat]
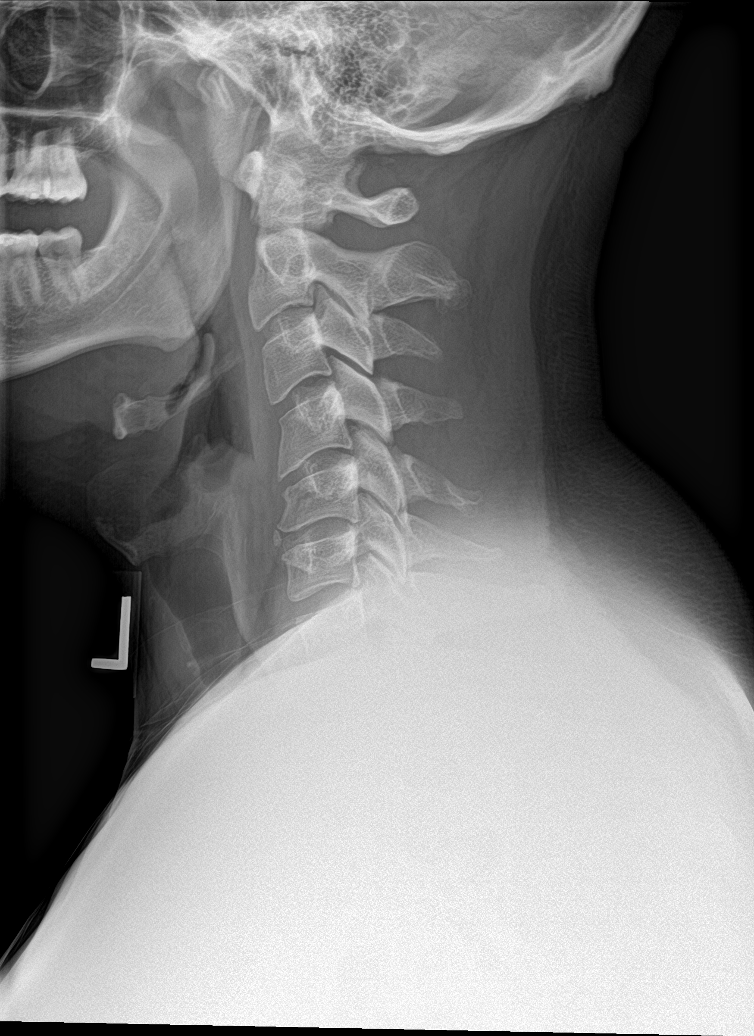

[c-spine obl (1 of 2)]
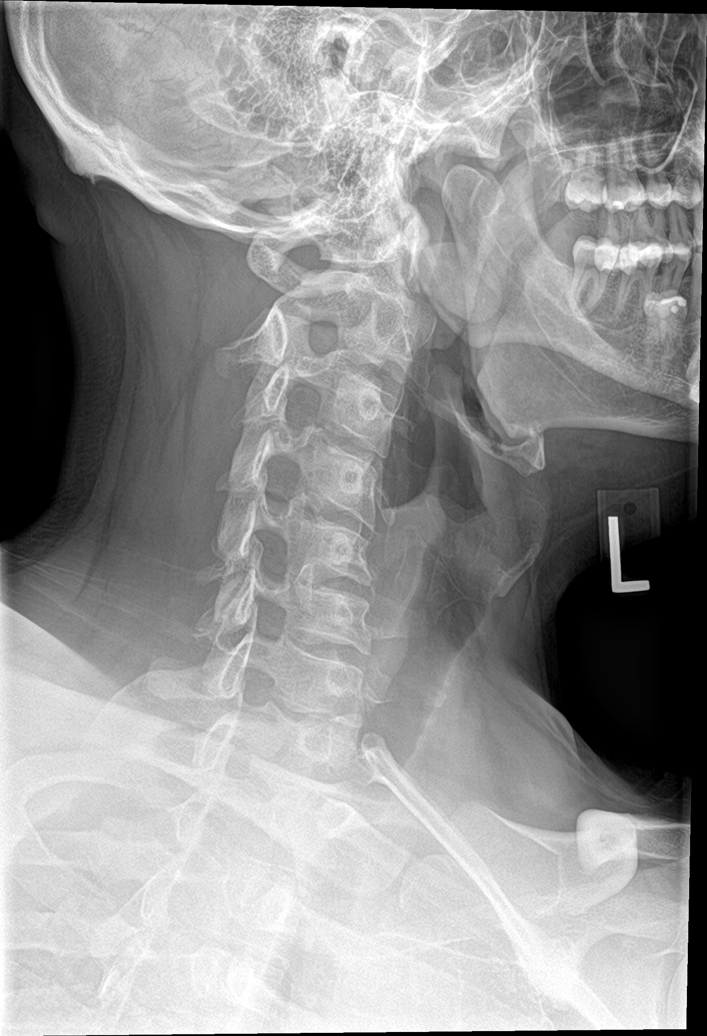

[c-spine obl (2 of 2)]
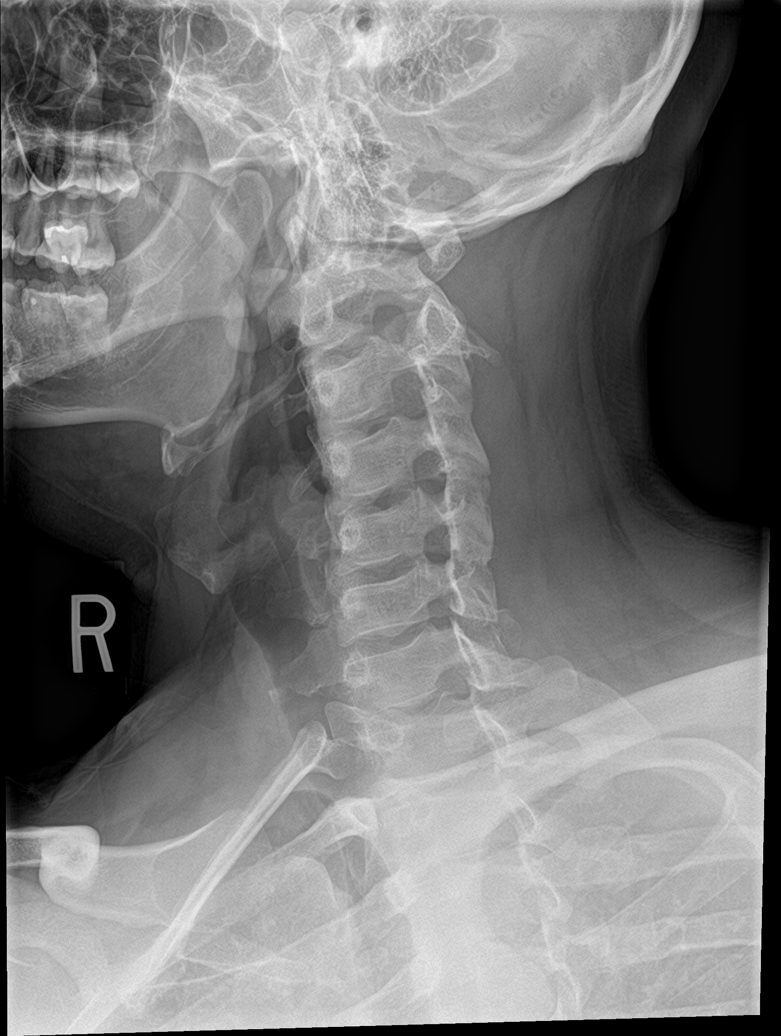

[c-spine ap]
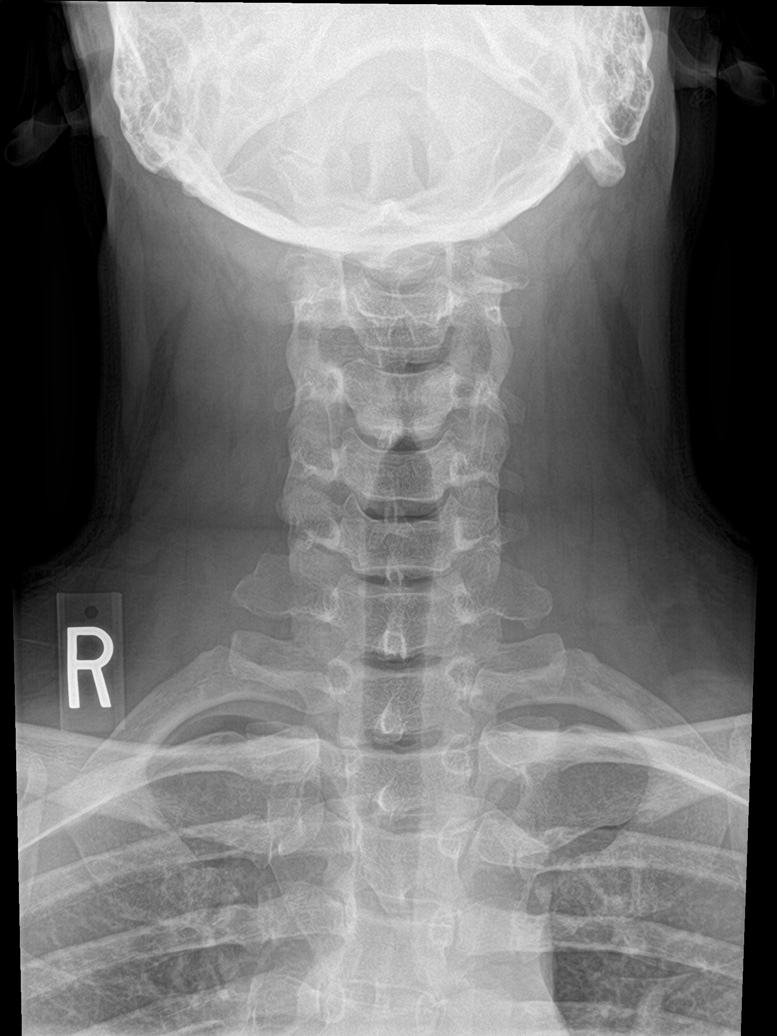

[c-spine open mouth]
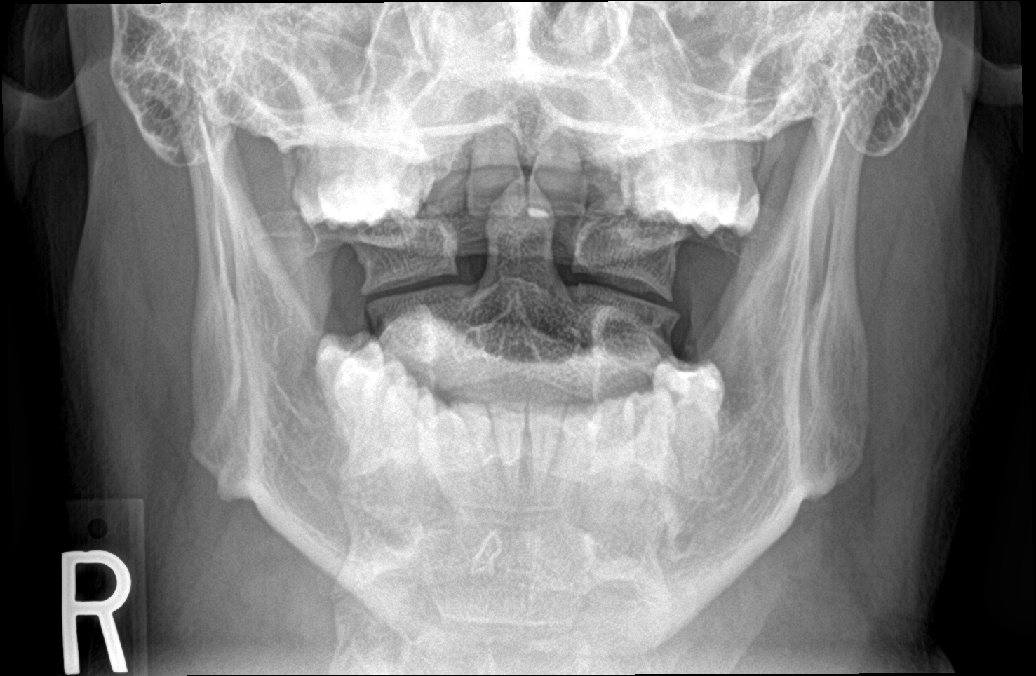

[c-spine swimmers]
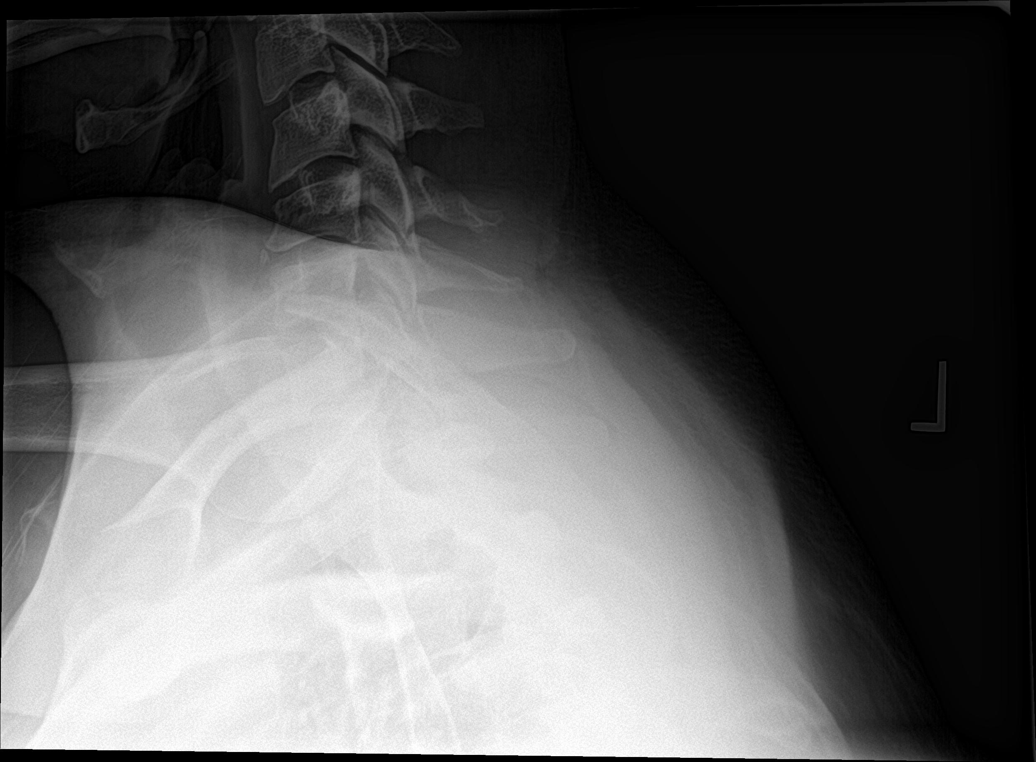

[6 of 6 positions shown; findings below may reference images not displayed]

FINDINGS: Mild reversal the normal cervical lordosis which is centered at the
C4-5 disc space. The dens is intact. Lateral masses of C1 are well
apposed to those of C2. No abnormally widened, perched or jumped
facets. No acute vertebral body height loss or fracture is seen.
Minimal spondylitic changes are present in the cervical spine which
are maximal at C4-5 and C5-6 including and anterior spur versus
calcified disc at the C5-6 level. Minimal uncinate spurring without
significant foraminal impingement. No prevertebral swelling or gas.
Airways patent. No acute abnormality in the upper chest or imaged
lung apices.
IMPRESSION: 1. Minimal spondylitic changes in the cervical spine which are
maximal at C4-5 and C5-6.
2. No acute osseous abnormality.
3. Mild reversal the normal cervical lordosis which is centered at
the C4-5 disc space. Possibly positional or degenerative versus
muscle spasm.

## 2022-01-05 ENCOUNTER — Ambulatory Visit: Payer: 59 | Admitting: Family Medicine

## 2022-01-22 ENCOUNTER — Encounter: Payer: Self-pay | Admitting: Family Medicine

## 2022-01-22 ENCOUNTER — Ambulatory Visit (INDEPENDENT_AMBULATORY_CARE_PROVIDER_SITE_OTHER): Payer: 59 | Admitting: Family Medicine

## 2022-01-22 VITALS — BP 134/84 | HR 72 | Ht 72.0 in | Wt 260.1 lb

## 2022-01-22 DIAGNOSIS — K625 Hemorrhage of anus and rectum: Secondary | ICD-10-CM

## 2022-01-22 DIAGNOSIS — E7849 Other hyperlipidemia: Secondary | ICD-10-CM | POA: Diagnosis not present

## 2022-01-22 DIAGNOSIS — R7301 Impaired fasting glucose: Secondary | ICD-10-CM

## 2022-01-22 DIAGNOSIS — E559 Vitamin D deficiency, unspecified: Secondary | ICD-10-CM | POA: Diagnosis not present

## 2022-01-22 DIAGNOSIS — E038 Other specified hypothyroidism: Secondary | ICD-10-CM | POA: Diagnosis not present

## 2022-01-22 NOTE — Progress Notes (Signed)
Established Patient Office Visit  Subjective:  Patient ID: Justin Jordan, male    DOB: [birth year]  Age: 31 y.o. MRN: [id number]  CC:  Chief Complaint  Patient presents with   Follow-up    Follow up, report feeling tired fatigued.     HPI Justin Jordan is a 31 y.o. male with past medical history of rectal bleeding, and chronic rhinitis presents for f/u of  chronic medical conditions. For the details of today's visit, please refer to the assessment and plan.     Past Medical History:  Diagnosis Date   Allergy    Anxiety    Asthma, chronic 05/06/2013   Followed in Pulmonary clinic/ [facility]/ [place]  - 05/04/2013 p extensive coaching HFA effectiveness =    75%    Chronic sinus infection    Depression     Past Surgical History:  Procedure Laterality Date   COLONOSCOPY WITH PROPOFOL N/A 10/29/2021   Procedure: COLONOSCOPY WITH PROPOFOL;  Surgeon: Harvel Quale, MD;  Location: [facility];  Service: Gastroenterology;  Laterality: N/A;  1130 ASA 1   POLYPECTOMY  10/29/2021   Procedure: POLYPECTOMY;  Surgeon: Montez Morita, Quillian Quince, MD;  Location: [facility];  Service: Gastroenterology;;    Family History  Problem Relation Age of Onset   Hypertension Father    Asthma Neg Hx     Social History   Socioeconomic History   Marital status: Married    Spouse name: Not on file   Number of children: Not on file   Years of education: Not on file   Highest education level: Not on file  Occupational History   Occupation: Scientist, water quality at [employer]     Employer: [employer]  Tobacco Use   Smoking status: Never   Smokeless tobacco: Never  Vaping Use   Vaping Use: Every day   Substances: Nicotine, Flavoring  Substance and Sexual Activity   Alcohol use: Yes    Alcohol/week: 1.0 - 8.0 standard drink of alcohol    Types: 1 - 8 Standard drinks or equivalent per week    Comment: 2-3 beers Qhs   Drug use: No   Sexual activity: Yes  Other Topics Concern   Not  on file  Social History Narrative   Marital status: married since 10/2015.      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  Total partners = 12.  Last STD screening 2011.   Social Determinants of Health   Financial Resource Strain: Not on file  Food Insecurity: Not on file  Transportation Needs: Not on file  Physical Activity: Not on file  Stress: Not on file  Social Connections: Not on file  Intimate Partner Violence: Not on file    Outpatient Medications Prior to Visit  Medication Sig Dispense Refill   ibuprofen (ADVIL) 200 MG tablet Take 800 mg by mouth every 8 (eight) hours as needed for moderate pain or headache.     triamcinolone ointment (KENALOG) 0.1 % Apply 1 Application topically 2 (two) times daily. Apply sparingly to affected  areas for no longer than 14 days consecutively 30 g 0   No facility-administered medications prior to visit.    Allergies  Allergen Reactions   Ginger Anaphylaxis   Amoxicillin Hives   Penicillins Rash    ROS Review of Systems  Constitutional:  Negative for fatigue and unexpected weight change.  Eyes:  Negative for photophobia and visual disturbance.  Respiratory:  Negative for chest tightness and shortness of breath.   Cardiovascular:  Negative for chest pain and palpitations.  Neurological:  Negative for dizziness and headaches.      Objective:    Physical Exam HENT:     Head: Normocephalic.     Right Ear: External ear normal.     Left Ear: External ear normal.  Cardiovascular:     Rate and Rhythm: Regular rhythm.     Heart sounds: No murmur heard. Pulmonary:     Effort: No respiratory distress.     Breath sounds: Normal  breath sounds.  Neurological:     Mental Status: He is alert.     BP 134/84   Pulse 72   Ht 6' (1.829 m)   Wt 260 lb 1.9 oz (118 kg)   SpO2 96%   BMI 35.28 kg/m  Wt Readings from Last 3 Encounters:  01/22/22 260 lb 1.9 oz (118 kg)  10/29/21 265 lb (120.2 kg)  10/06/21 263 lb 1.6 oz (119.3 kg)    Lab Results  Component Value Date   TSH 1.530 07/07/2021   Lab Results  Component Value Date   WBC 8.0 07/07/2021   HGB 16.2 07/07/2021   HCT 48.0 07/07/2021   MCV 91 07/07/2021   PLT 241 07/07/2021   Lab Results  Component Value Date   NA 140 07/07/2021   K 4.4 07/07/2021   CO2 24 07/07/2021   GLUCOSE 96 07/07/2021   BUN 12 07/07/2021   CREATININE 0.79 07/07/2021   BILITOT 0.7 07/07/2021   ALKPHOS 85 07/07/2021   AST 44 (H) 07/07/2021   ALT 79 (H) 07/07/2021   PROT 7.1 07/07/2021   ALBUMIN 4.7 07/07/2021   CALCIUM 9.4 07/07/2021   EGFR 123 07/07/2021   Lab Results  Component Value Date   CHOL 203 (H) 07/07/2021   Lab Results  Component Value Date   HDL 39 (L) 07/07/2021   Lab Results  Component Value Date   LDLCALC 124 (H) 07/07/2021   Lab Results  Component Value Date   TRIG 227 (H) 07/07/2021   Lab Results  Component Value Date   CHOLHDL 5.2 (H) 07/07/2021   Lab Results  Component Value Date   HGBA1C 5.1 07/07/2021      Assessment & Plan:  Rectal bleeding Assessment & Plan: Resolved He had a colonoscopy on 10/29/2021 with findings of one colonic tubular adenoma, repeat colonoscopy in 7 years   IFG (impaired fasting glucose) -     Hemoglobin A1c  Vitamin D deficiency -     VITAMIN D 25 Hydroxy (Vit-D Deficiency, Fractures)  Other specified hypothyroidism -     TSH + free T4  Other hyperlipidemia -     Lipid panel -     CMP14+EGFR -     CBC with Differential/Platelet    Follow-up: Return in about 5 months (around 06/23/2022).   Alvira Monday, FNP

## 2022-01-22 NOTE — Patient Instructions (Signed)
I appreciate the opportunity to provide care to you today!    Follow up:  5 months CPE  Labs: please stop by the lab today to get your blood drawn (CBC, CMP, TSH, Lipid profile, HgA1c, Vit D)     Please continue to a heart-healthy diet and increase your physical activities. Try to exercise for at least three times a week.      It was a pleasure to see you and I look forward to continuing to work together on your health and well-being. Please do not hesitate to call the office if you need care or have questions about your care.   Have a wonderful day and week. With Gratitude, Gilmore Laroche MSN, FNP-BC

## 2022-01-22 NOTE — Assessment & Plan Note (Signed)
Resolved He had a colonoscopy on 10/29/2021 with findings of one colonic tubular adenoma, repeat colonoscopy in 7 years

## 2022-01-23 LAB — LIPID PANEL
Chol/HDL Ratio: 6 ratio — ABNORMAL HIGH (ref 0.0–5.0)
Cholesterol, Total: 215 mg/dL — ABNORMAL HIGH (ref 100–199)
HDL: 36 mg/dL — ABNORMAL LOW (ref >39)
LDL Chol Calc (NIH): 122 mg/dL — ABNORMAL HIGH (ref 0–99)
Triglycerides: 324 mg/dL — ABNORMAL HIGH (ref 0–149)
VLDL Cholesterol Cal: 57 mg/dL — ABNORMAL HIGH (ref 5–40)

## 2022-01-23 LAB — CBC WITH DIFFERENTIAL/PLATELET
Basophils Absolute: 0.1 10*3/uL (ref 0.0–0.2)
Basos: 1 %
EOS (ABSOLUTE): 0.2 10*3/uL (ref 0.0–0.4)
Eos: 3 %
Hematocrit: 48.6 % (ref 37.5–51.0)
Hemoglobin: 16.4 g/dL (ref 13.0–17.7)
Immature Grans (Abs): 0 10*3/uL (ref 0.0–0.1)
Immature Granulocytes: 0 %
Lymphocytes Absolute: 2.6 10*3/uL (ref 0.7–3.1)
Lymphs: 32 %
MCH: 29.7 pg (ref 26.6–33.0)
MCHC: 33.7 g/dL (ref 31.5–35.7)
MCV: 88 fL (ref 79–97)
Monocytes Absolute: 0.7 10*3/uL (ref 0.1–0.9)
Monocytes: 9 %
Neutrophils Absolute: 4.4 10*3/uL (ref 1.4–7.0)
Neutrophils: 55 %
Platelets: 275 10*3/uL (ref 150–450)
RBC: 5.52 x10E6/uL (ref 4.14–5.80)
RDW: 11.8 % (ref 11.6–15.4)
WBC: 8 10*3/uL (ref 3.4–10.8)

## 2022-01-23 LAB — CMP14+EGFR
ALT: 64 IU/L — ABNORMAL HIGH (ref 0–44)
AST: 38 IU/L (ref 0–40)
Albumin/Globulin Ratio: 2.3 — ABNORMAL HIGH (ref 1.2–2.2)
Albumin: 5.1 g/dL (ref 4.3–5.2)
Alkaline Phosphatase: 75 IU/L (ref 44–121)
BUN/Creatinine Ratio: 10 (ref 9–20)
BUN: 10 mg/dL (ref 6–20)
Bilirubin Total: 0.7 mg/dL (ref 0.0–1.2)
CO2: 22 mmol/L (ref 20–29)
Calcium: 9.8 mg/dL (ref 8.7–10.2)
Chloride: 101 mmol/L (ref 96–106)
Creatinine, Ser: 1.05 mg/dL (ref 0.76–1.27)
Globulin, Total: 2.2 g/dL (ref 1.5–4.5)
Glucose: 90 mg/dL (ref 70–99)
Potassium: 4.4 mmol/L (ref 3.5–5.2)
Sodium: 139 mmol/L (ref 134–144)
Total Protein: 7.3 g/dL (ref 6.0–8.5)
eGFR: 98 mL/min/{1.73_m2} (ref >59)

## 2022-01-23 LAB — HEMOGLOBIN A1C
Est. average glucose Bld gHb Est-mCnc: 108 mg/dL
Hgb A1c MFr Bld: 5.4 % (ref 4.8–5.6)

## 2022-01-23 LAB — TSH+FREE T4
Free T4: 1.17 ng/dL (ref 0.82–1.77)
TSH: 1.69 u[IU]/mL (ref 0.450–4.500)

## 2022-01-23 LAB — VITAMIN D 25 HYDROXY (VIT D DEFICIENCY, FRACTURES): Vit D, 25-Hydroxy: 20.9 ng/mL — ABNORMAL LOW (ref 30.0–100.0)

## 2022-01-25 ENCOUNTER — Other Ambulatory Visit: Payer: Self-pay | Admitting: Family Medicine

## 2022-01-25 DIAGNOSIS — E559 Vitamin D deficiency, unspecified: Secondary | ICD-10-CM

## 2022-01-25 MED ORDER — VITAMIN D (ERGOCALCIFEROL) 1.25 MG (50000 UNIT) PO CAPS: 50000.0000 [IU] | ORAL_CAPSULE | ORAL | 3 refills | Status: DC

## 2022-05-21 ENCOUNTER — Telehealth: Payer: Self-pay | Admitting: Urgent Care

## 2022-05-21 DIAGNOSIS — H938X2 Other specified disorders of left ear: Secondary | ICD-10-CM

## 2022-05-21 MED ORDER — IPRATROPIUM BROMIDE 0.03 % NA SOLN: 2.0000 | Freq: Two times a day (BID) | NASAL | 0 refills | Status: DC

## 2022-05-21 MED ORDER — DEBROX 6.5 % OT SOLN: 5.0000 [drp] | Freq: Two times a day (BID) | OTIC | 0 refills | Status: AC

## 2022-05-21 NOTE — Progress Notes (Signed)
E-Visit for Ear Pain - Eustachian Tube Dysfunction   We are sorry that you are not feeling well. Here is how we plan to help!  Based on what you have shared with me it looks like you likely have either a cerumen impaction (earwax) or Eustachian Tube Dysfunction.  Eustachian Tube Dysfunction is a condition where the tubes that connect your middle ears to your upper throat become blocked. This can lead to discomfort, hearing difficulties and a feeling of fullness in your ear. Eustachian tube dysfunction usually resolves itself in a few days. The usual symptoms include: Hearing problems Tinnitus, or ringing in your ears Clicking or popping sounds A feeling of fullness in your ears Pain that mimics an ear infection Dizziness, vertigo or balance problems A "tickling" sensation in your ears  ?Eustachian tube dysfunction symptoms may get worse in higher altitudes. This is called barotrauma, and it can happen while scuba diving, flying in an airplane or driving in the mountains.   What causes eustachian tube dysfunction? Allergies and infections (like the common cold and the flu) are the most common causes of eustachian tube dysfunction. These conditions can cause inflammation and mucus buildup, leading to blockage. GERD, or chronic acid reflux, can also cause ETD. This is because stomach acid can back up into your throat and result in inflammation. As mentioned above, altitude changes can also cause ETD.   What are some common eustachian tube dysfunction treatments? In most cases, treatment isn't necessary because ETD often resolves on its own. However, you might need treatment if your symptoms linger for more than two weeks.    Eustachian tube dysfunction treatment depends on the cause and the severity of your condition. Treatments may include home remedies, medications or, in severe cases, surgery.     HOME CARE: Sometimes simple home remedies can help with mild cases of eustachian tube  dysfunction. To try and clear the blockage, you can: Chew gum. Yawn. Swallow. Try the Valsalva maneuver (breathing out forcefully while closing your mouth and pinching your nostrils). Use a saline spray to clear out nasal passages.  MEDICATIONS: Over-the-counter medications can help if allergies are causing eustachian tube dysfunction. Try antihistamines (like cetirizine or diphenhydramine) to ease your symptoms. If you have discomfort, pain relievers -- such as acetaminophen or ibuprofen -- can help.  Sometimes intranasal glucocorticosteroids (like Flonase or Nasacort) help.  I have prescribed Ipratropium Bromide nasal spray 0.03% two sprays in each nostril 2-3 times a day   If the nasal spray does not help your ear, then you can try the debrox drops to see if it softens any possible retained wax.   GET HELP RIGHT AWAY IF: Fever is over 102.2 degrees. You develop progressive ear pain or hearing loss. Ear symptoms persist longer than 3 days after treatment.  MAKE SURE YOU: Understand these instructions. Will watch your condition. Will get help right away if you are not doing well or get worse.  Thank you for choosing an e-visit.  Your e-visit answers were reviewed by a board certified advanced clinical practitioner to complete your personal care plan. Depending upon the condition, your plan could have included both over the counter or prescription medications.  Please review your pharmacy choice. Make sure the pharmacy is open so you can pick up the prescription now. If there is a problem, you may contact your provider through [employer] and have the prescription routed to another pharmacy.  Your safety is important to [place]. If you have drug allergies check  your prescription carefully.   For the next 24 hours you can use MyChart to ask questions about today's visit, request a non-urgent call back, or ask for a work or school excuse. You will get an email with a survey after your  eVisit asking about your experience. We would appreciate your feedback. I hope that your e-visit has been valuable and will aid in your recovery.       I have spent 5 minutes in review of e-visit questionnaire, review and updating patient chart, medical decision making and response to patient.   [place], [place]

## 2022-06-25 ENCOUNTER — Encounter: Payer: 59 | Admitting: Family Medicine

## 2022-07-01 ENCOUNTER — Telehealth: Payer: 59 | Admitting: Nurse Practitioner

## 2022-07-01 DIAGNOSIS — L259 Unspecified contact dermatitis, unspecified cause: Secondary | ICD-10-CM

## 2022-07-01 MED ORDER — PREDNISONE 10 MG PO TABS: ORAL_TABLET | ORAL | 0 refills | Status: DC

## 2022-07-01 NOTE — Progress Notes (Signed)
E Visit for Rash  We are sorry that you are not feeling well. Here is how we plan to help!  Based on what you shared with me it looks like you have contact dermatitis.  Contact dermatitis is a skin rash caused by something that touches the skin and causes irritation or inflammation.  Your skin may be red, swollen, dry, cracked, and itch.  The rash should go away in a few days but can last a few weeks.  If you get a rash, it's important to figure out what caused it so the irritant can be avoided in the future. and I am prescribing a two week course of steroids (37 tablets of 10 mg prednisone).  Days 1-4 take 4 tablets (40 mg) daily  Days 5-8 take 3 tablets (30 mg) daily, Days 9-11 take 2 tablets (20 mg) daily, Days 12-14 take 1 tablet (10 mg) daily.   Since this has happened to you in the past we do recommend follow up with your primary care provider or dermatologist   HOME CARE:  Take cool showers and avoid direct sunlight. Apply cool compress or wet dressings. Take a bath in an oatmeal bath.  Sprinkle content of one Aveeno packet under running faucet with comfortably warm water.  Bathe for 15-20 minutes, 1-2 times daily.  Pat dry with a towel. Do not rub the rash. Use hydrocortisone cream. Take an antihistamine like Benadryl for widespread rashes that itch.  The adult dose of Benadryl is 25-50 mg by mouth 4 times daily. Caution:  This type of medication may cause sleepiness.  Do not drink alcohol, drive, or operate dangerous machinery while taking antihistamines.  Do not take these medications if you have prostate enlargement.  Read package instructions thoroughly on all medications that you take.  GET HELP RIGHT AWAY IF:  Symptoms don't go away after treatment. Severe itching that persists. If you rash spreads or swells. If you rash begins to smell. If it blisters and opens or develops a yellow-brown crust. You develop a fever. You have a sore throat. You become short of breath.  MAKE  SURE YOU:  Understand these instructions. Will watch your condition. Will get help right away if you are not doing well or get worse.  Thank you for choosing an e-visit.  Your e-visit answers were reviewed by a board certified advanced clinical practitioner to complete your personal care plan. Depending upon the condition, your plan could have included both over the counter or prescription medications.  Please review your pharmacy choice. Make sure the pharmacy is open so you can pick up prescription now. If there is a problem, you may contact your provider through [employer] and have the prescription routed to another pharmacy.  Your safety is important to [place]. If you have drug allergies check your prescription carefully.   For the next 24 hours you can use MyChart to ask questions about today's visit, request a non-urgent call back, or ask for a work or school excuse. You will get an email in the next two days asking about your experience. I hope that your e-visit has been valuable and will speed your recovery. /  Meds ordered this encounter  Medications   predniSONE (DELTASONE) 10 MG tablet    Sig: Take 4 tablets (40mg ) on days 1-4, then 3 tablets (30mg ) on days 5-8, then 2 tablets (20mg ) on days 9-11, then 1 tablet daily for days 12-14. Take with food.    Dispense:  37 tablet  Refill:  0    I spent approximately 5 minutes reviewing the patient's history, current symptoms and coordinating their care today.

## 2022-07-07 ENCOUNTER — Ambulatory Visit (INDEPENDENT_AMBULATORY_CARE_PROVIDER_SITE_OTHER): Payer: 59 | Admitting: Family Medicine

## 2022-07-07 ENCOUNTER — Encounter: Payer: Self-pay | Admitting: Family Medicine

## 2022-07-07 VITALS — BP 118/82 | HR 100 | Wt 274.0 lb

## 2022-07-07 DIAGNOSIS — R7301 Impaired fasting glucose: Secondary | ICD-10-CM

## 2022-07-07 DIAGNOSIS — Z0001 Encounter for general adult medical examination with abnormal findings: Secondary | ICD-10-CM | POA: Diagnosis not present

## 2022-07-07 DIAGNOSIS — E559 Vitamin D deficiency, unspecified: Secondary | ICD-10-CM | POA: Diagnosis not present

## 2022-07-07 DIAGNOSIS — E038 Other specified hypothyroidism: Secondary | ICD-10-CM

## 2022-07-07 DIAGNOSIS — I209 Angina pectoris, unspecified: Secondary | ICD-10-CM | POA: Diagnosis not present

## 2022-07-07 DIAGNOSIS — R079 Chest pain, unspecified: Secondary | ICD-10-CM | POA: Diagnosis not present

## 2022-07-07 DIAGNOSIS — E7849 Other hyperlipidemia: Secondary | ICD-10-CM

## 2022-07-07 MED ORDER — NITROGLYCERIN 0.4 MG SL SUBL: 0.4000 mg | SUBLINGUAL_TABLET | SUBLINGUAL | 3 refills | Status: DC | PRN

## 2022-07-07 MED ORDER — VITAMIN D (ERGOCALCIFEROL) 1.25 MG (50000 UNIT) PO CAPS: 50000.0000 [IU] | ORAL_CAPSULE | ORAL | 1 refills | Status: AC

## 2022-07-07 NOTE — Progress Notes (Signed)
Cpe

## 2022-07-07 NOTE — Assessment & Plan Note (Addendum)
Complains of a sharp stabbing pain in his chest when stressed or irritated Most recent episode was 10 minutes ago Duration of symptoms 10 -15 seconds Pain is nonradiating No shortness of breath,, palpitation, orthopedic noted No swelling of lower extremities Chest pain likely due to stress EKG in the clinic shows normal sinus rhythm We will treat today with nitroglycerin 0.4 mg to take every 5 minutes as needed Instruct not to take more than 3 tablets in 15 minutes

## 2022-07-07 NOTE — Assessment & Plan Note (Signed)
Physical exam as documented Discussed heart-healthy diet  Encouraged to Exercise: If you are able: 30 -60 minutes a day ,4 days a week, or 150 minutes a week. The longer the better. Combine stretch, strength, and aerobic activities Encourage to eat whole Food, Plant Predominant Nutrition is highly recommended: Eat Plenty of vegetables, Mushrooms, fruits, Legumes, Whole Grains, Nuts, seeds in lieu of processed meats, processed snacks/pastries red meat, poultry, eggs.  Will f/u in 1 year for CPE    

## 2022-07-07 NOTE — Patient Instructions (Addendum)
I appreciate the opportunity to provide care to you today!    Follow up:  3 months  Labs: please stop by the lab today to get your blood drawn (CBC, CMP, TSH, Lipid profile, HgA1c, Vit D)  Nitroglycerin Allow to dissolve under tongue without swallowing (take while sitting down) For relief of angina, no more than 3 tablets within 15 minutes (then call 911)  Nitroglycerin should be kept in the original glass container and must be tightly capped after each use to prevent loss of tablet potency. Tablets may lose potency 6-12 months after initial opening of glass container. This can be detected via a lack of burning sensation when placed under the tongue.  Please continue to a heart-healthy diet and increase your physical activities. Try to exercise for at least five days a week.      It was a pleasure to see you and I look forward to continuing to work together on your health and well-being. Please do not hesitate to call the office if you need care or have questions about your care.   Have a wonderful day and week. With Gratitude, Gilmore Laroche MSN, FNP-BC

## 2022-07-07 NOTE — Progress Notes (Signed)
Complete physical exam  Patient: Justin Jordan   DOB: [birth year]   32 y.o. Male  MRN: [id number]  Subjective:    Chief Complaint  Patient presents with   Annual Exam    Cpe today, pt reports states he experienced chest pain last month, his wife was put in hospital for x 1 week. Has went away.     Justin Jordan is a 32 y.o. male who presents today for a complete physical exam. He reports consuming a general diet. The patient does not participate in regular exercise at present. He generally feels well. He reports sleeping well. He does have additional problems to discuss today.  The patient is seen in the clinic his 2 children.  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   Most recent fall risk assessment:    01/22/2022    9:40 AM  Fall Risk   Falls in the past year? 0  Number falls in past yr: 0  Injury with Fall? 0  Risk for fall due to : No Fall Risks  Follow up Falls evaluation completed     Most recent depression screenings:    07/07/2022    2:45 PM 01/22/2022    9:40 AM  PHQ 2/9 Scores  PHQ - 2 Score 5 3  PHQ- 9 Score 16 11      Patient Active Problem List   Diagnosis Date Noted   Encounter for general adult medical examination with abnormal findings 07/07/2022   Angina pectoris (HCC) 07/07/2022   Rectal bleeding 10/06/2021   Herpes zoster 07/04/2021   Adjustment reaction 06/07/2013   Chronic rhinitis 05/18/2013   INGROWN TOENAIL 12/21/2008   Past Medical History:  Diagnosis Date   Allergy    Anxiety    Asthma, chronic 05/06/2013   Followed in Pulmonary clinic/ [facility]/ [facility]  - 05/04/2013 p extensive coaching HFA effectiveness =    75%    Chronic sinus infection    Depression    Past Surgical History:  Procedure Laterality Date   COLONOSCOPY WITH PROPOFOL N/A 10/29/2021   Procedure: COLONOSCOPY WITH PROPOFOL;  Surgeon: Dolores Frame, MD;  Location: [facility];  Service: Gastroenterology;  Laterality: N/A;  1130 ASA 1   POLYPECTOMY   10/29/2021   Procedure: POLYPECTOMY;  Surgeon: Dolores Frame, MD;  Location: [facility];  Service: Gastroenterology;;   Social History   Tobacco Use   Smoking status: Never   Smokeless tobacco: Never  Vaping Use   Vaping Use: Every day   Substances: Nicotine, Flavoring  Substance Use Topics   Alcohol use: Yes    Alcohol/week: 1.0 - 8.0 standard drink of alcohol    Types: 1 - 8 Standard drinks or equivalent per week    Comment: 2-3 beers Qhs   Drug use: No   Social History   Socioeconomic History   Marital status: Married    Spouse name: Not on file   Number of children: Not on file   Years of education: Not on file   Highest education level: Not on file  Occupational History   Occupation: Conservation officer, nature at [employer]     Employer: [employer]  Tobacco Use   Smoking status: Never   Smokeless tobacco: Never  Vaping Use   Vaping Use: Every day   Substances: Nicotine, Flavoring  Substance and Sexual Activity   Alcohol use: Yes    Alcohol/week: 1.0 - 8.0 standard drink of alcohol    Types: 1 - 8 Standard drinks or equivalent  per week    Comment: 2-3 beers Qhs   Drug use: No   Sexual activity: Yes  Other Topics Concern   Not on file  Social History Narrative   Marital status: married since 10/2015.      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  Total partners = 12.  Last STD screening 2011.   Social Determinants of Health   Financial Resource Strain: Not on file  Food Insecurity: Not on file  Transportation Needs: Not on file  Physical Activity: Not on file  Stress: Not on file  Social Connections: Not on file  Intimate Partner Violence: Not on file   Family  Status  Relation Name Status   Mother  Alive, age 79y   Father  Alive, age 31y   Brother  Alive   Neg Hx  (Not Specified)   Family History  Problem Relation Age of Onset   Hypertension Father    Asthma Neg Hx    Allergies  Allergen Reactions   Ginger Anaphylaxis   Amoxicillin Hives   Penicillins Rash      Patient Care Team: Gilmore Laroche, FNP as PCP - General (Family Medicine)   Outpatient Medications Prior to Visit  Medication Sig   ibuprofen (ADVIL) 200 MG tablet Take 800 mg by mouth every 8 (eight) hours as needed for moderate pain or headache.   ipratropium (ATROVENT) 0.03 % nasal spray Place 2 sprays into both nostrils every 12 (twelve) hours.   predniSONE (DELTASONE) 10 MG tablet Take 4 tablets (40mg ) on days 1-4, then 3 tablets (30mg ) on days 5-8, then 2 tablets (20mg ) on days 9-11, then 1 tablet daily for days 12-14. Take with food.   triamcinolone ointment (KENALOG) 0.1 % Apply 1 Application topically 2 (two) times daily. Apply sparingly to affected areas for no longer than 14 days consecutively   [DISCONTINUED] Vitamin D, Ergocalciferol, (DRISDOL) 1.25 MG (50000 UNIT) CAPS capsule Take 1 capsule (50,000 Units total) by mouth every 7 (seven) days.   No facility-administered medications prior to visit.    Review of Systems  Cardiovascular:  Positive for chest pain. Negative for palpitations.       Objective:    BP 118/82   Pulse 100   Wt 274 lb 0.6 oz (124.3 kg)   SpO2 97%   BMI 37.17 kg/m  BP Readings from Last 3 Encounters:  07/07/22 118/82  01/22/22 134/84  10/29/21 (!) 93/44   Wt Readings from Last 3 Encounters:  07/07/22 274 lb 0.6 oz (124.3 kg)  01/22/22 260 lb 1.9 oz (118 kg)  10/29/21 265 lb (120.2 kg)      Physical Exam  No results found for any visits on 07/07/22. Last CBC Lab Results  Component Value Date   WBC 8.0 01/22/2022   HGB 16.4 01/22/2022   HCT 48.6 01/22/2022   MCV 88 01/22/2022   MCH 29.7 01/22/2022   RDW 11.8  01/22/2022   PLT 275 01/22/2022   Last metabolic panel Lab Results  Component Value Date   GLUCOSE 90  01/22/2022   NA 139 01/22/2022   K 4.4 01/22/2022   CL 101 01/22/2022   CO2 22 01/22/2022   BUN 10 01/22/2022   CREATININE 1.05 01/22/2022   EGFR 98 01/22/2022   CALCIUM 9.8 01/22/2022   PROT 7.3 01/22/2022   ALBUMIN 5.1 01/22/2022   LABGLOB 2.2 01/22/2022   AGRATIO 2.3 (H) 01/22/2022   BILITOT 0.7 01/22/2022   ALKPHOS 75 01/22/2022   AST 38 01/22/2022   ALT 64 (H) 01/22/2022   Last lipids Lab Results  Component Value Date   CHOL 215 (H) 01/22/2022   HDL 36 (L) 01/22/2022   LDLCALC 122 (H) 01/22/2022   TRIG 324 (H) 01/22/2022   CHOLHDL 6.0 (H) 01/22/2022   Last hemoglobin A1c Lab Results  Component Value Date   HGBA1C 5.4 01/22/2022   Last thyroid functions Lab Results  Component Value Date   TSH 1.690 01/22/2022   Last vitamin D Lab Results  Component Value Date   VD25OH 20.9 (L) 01/22/2022   Last vitamin B12 and Folate No results found for: "VITAMINB12", "FOLATE"      Assessment & Plan:    Routine Health Maintenance and Physical Exam  Immunization History  Administered Date(s) Administered   DTP 05/15/1991, 07/24/1991, 12/22/1991, 07/17/1992   HIB (PRP-OMP) 05/15/1991, 07/24/1991, 12/22/1991, 07/17/1992   Hepatitis B 04/03/1991, 05/15/1991, 12/22/1991   Influenza Split 11/16/2012   Influenza Whole 12/21/2008   MMR 07/17/1992, 09/26/1998   OPV 05/15/1991, 07/24/1991, 07/17/1992   Td 09/26/1998   Tdap 03/10/2016    Health Maintenance  Topic Date Due   COVID-19 Vaccine (1) Never done   INFLUENZA VACCINE  09/17/2022   DTaP/Tdap/Td (7 - Td or Tdap) 03/10/2026   COLONOSCOPY (Pts 45-34yrs Insurance coverage will need to be confirmed)  10/29/2028   Hepatitis C Screening  Completed   HIV Screening  Completed   HPV VACCINES  Aged Out    Discussed health benefits of physical activity, and encouraged him to engage in regular exercise appropriate  for his age and condition.  Encounter for general adult medical examination with abnormal findings Assessment & Plan: Physical exam as documented Discussed heart-healthy diet  Encouraged to Exercise: If you are able: 30 -60 minutes a day ,4 days a week, or 150 minutes a week. The longer the better. Combine stretch, strength, and aerobic activities Encourage to eat whole Food, Plant Predominant Nutrition is highly recommended: Eat Plenty of vegetables, Mushrooms, fruits, Legumes, Whole Grains, Nuts, seeds in lieu of processed meats, processed snacks/pastries red meat, poultry, eggs.  Will f/u in 1 year for CPE      Angina pectoris [facility]) Assessment & Plan: Complains of a sharp stabbing pain in his chest when stressed or irritated Most recent episode was 10 minutes ago Duration of symptoms 10 -15 seconds Pain is nonradiating No shortness of breath,, palpitation, orthopedic noted No swelling of lower extremities Chest pain likely due to stress EKG in the clinic shows normal sinus rhythm We will treat today with nitroglycerin 0.4 mg to take every 5 minutes as needed Instruct not to take more than 3 tablets in 15 minutes    Vitamin D deficiency -     VITAMIN D 25 Hydroxy (Vit-D Deficiency, Fractures) -     Vitamin D (Ergocalciferol); Take 1 capsule (50,000 Units total) by mouth every 7 (seven) days.  Dispense: 20 capsule; Refill: 1  IFG (impaired fasting glucose) -     Hemoglobin A1c  Other specified hypothyroidism -     TSH +  free T4  Other hyperlipidemia -     CMP14+EGFR -     CBC with Differential/Platelet -     Lipid panel  Chest pain, unspecified type -     EKG 12-Lead -     Nitroglycerin; Place 1 tablet (0.4 mg total) under the tongue every 5 (five) minutes as needed for chest pain.  Dispense: 50 tablet; Refill: 3    Return in about 3 months (around 10/07/2022).     Gilmore Laroche, FNP

## 2022-07-19 IMAGING — DX DG FOOT COMPLETE 3+V*L*
3 series · 3 of 3 positions shown · non-contrast
Comparison: None.

CLINICAL DATA: Tripping injury, lateral foot pain

EXAM:
LEFT FOOT - COMPLETE 3+ VIEW

[foot ap]
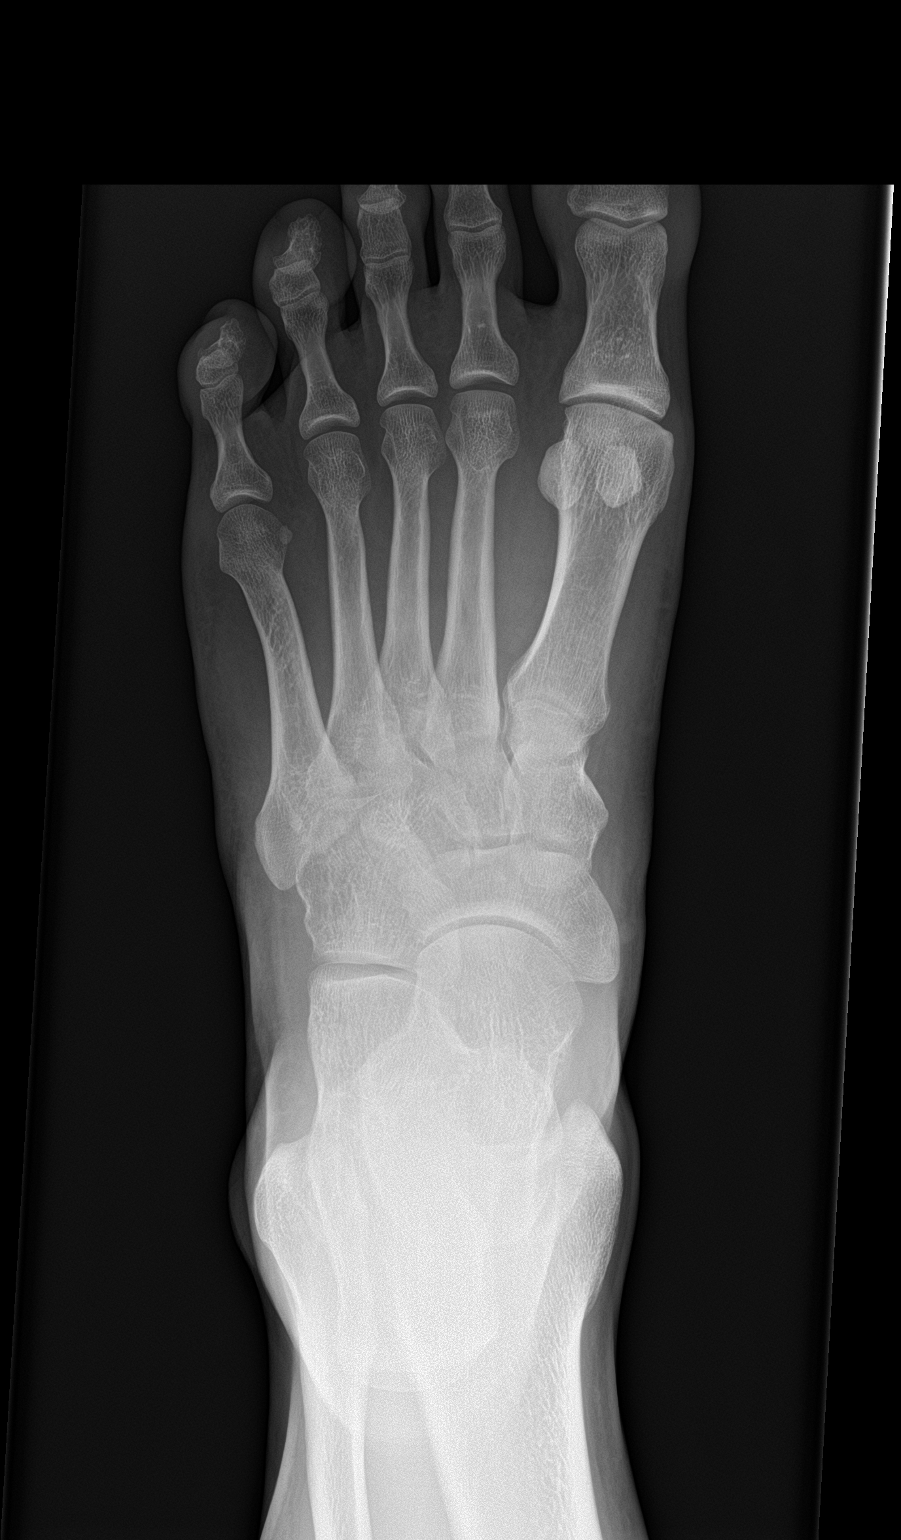

[foot obl]
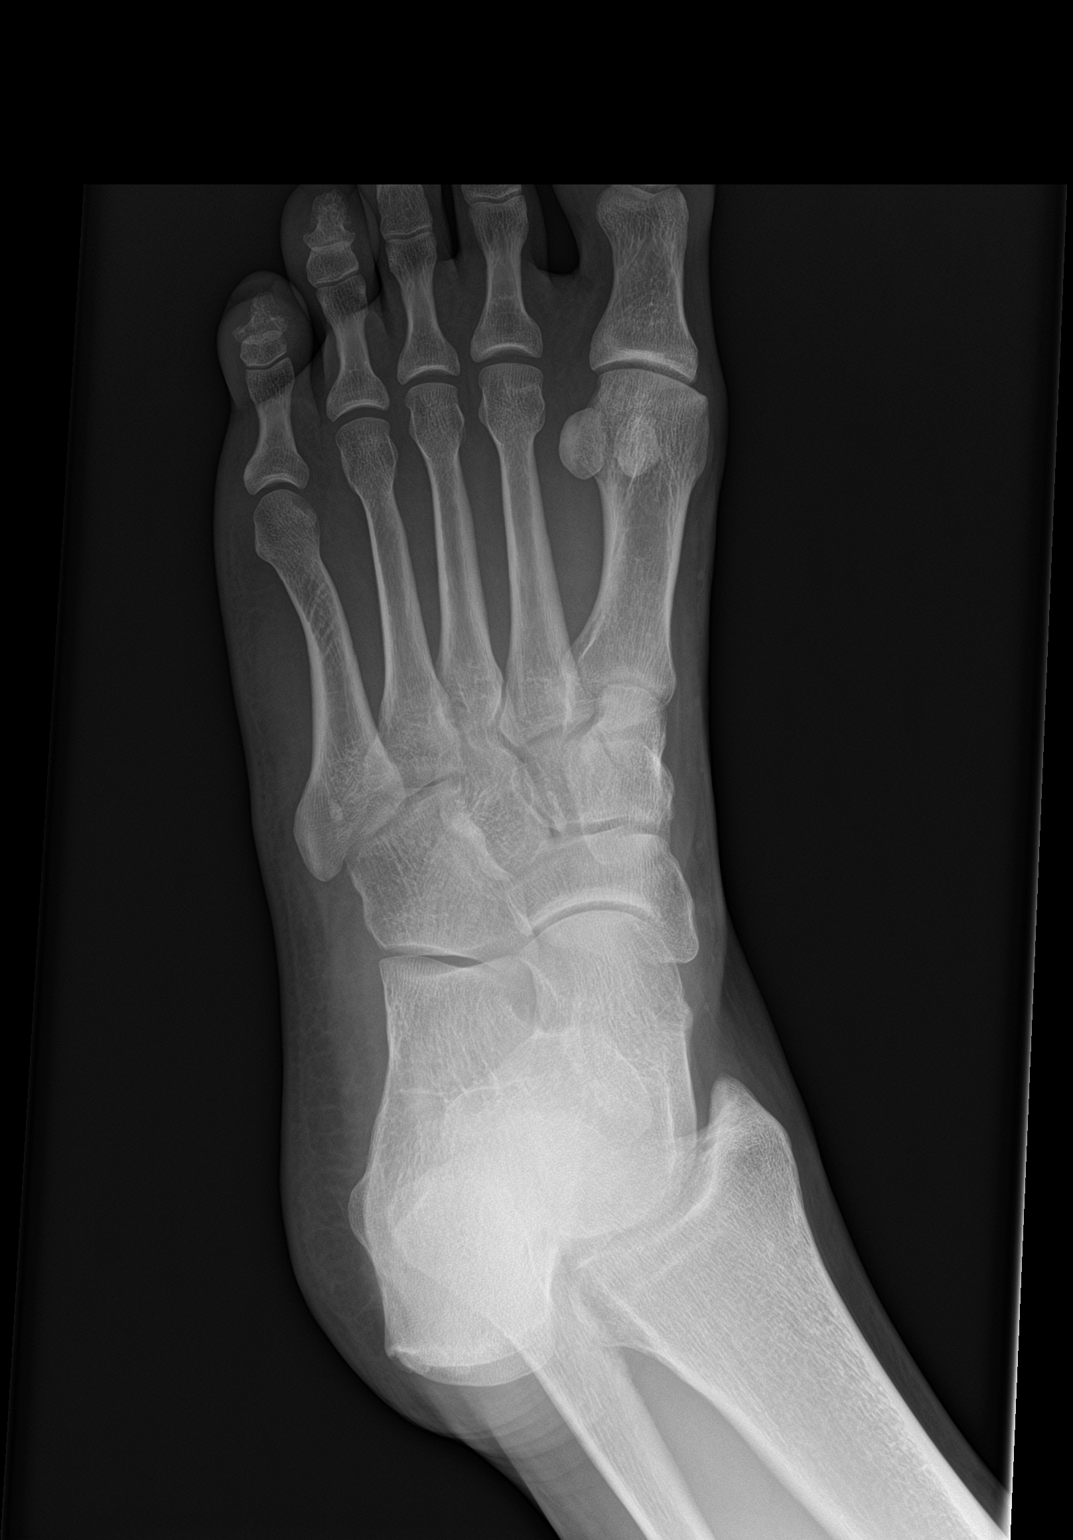

[foot lat]
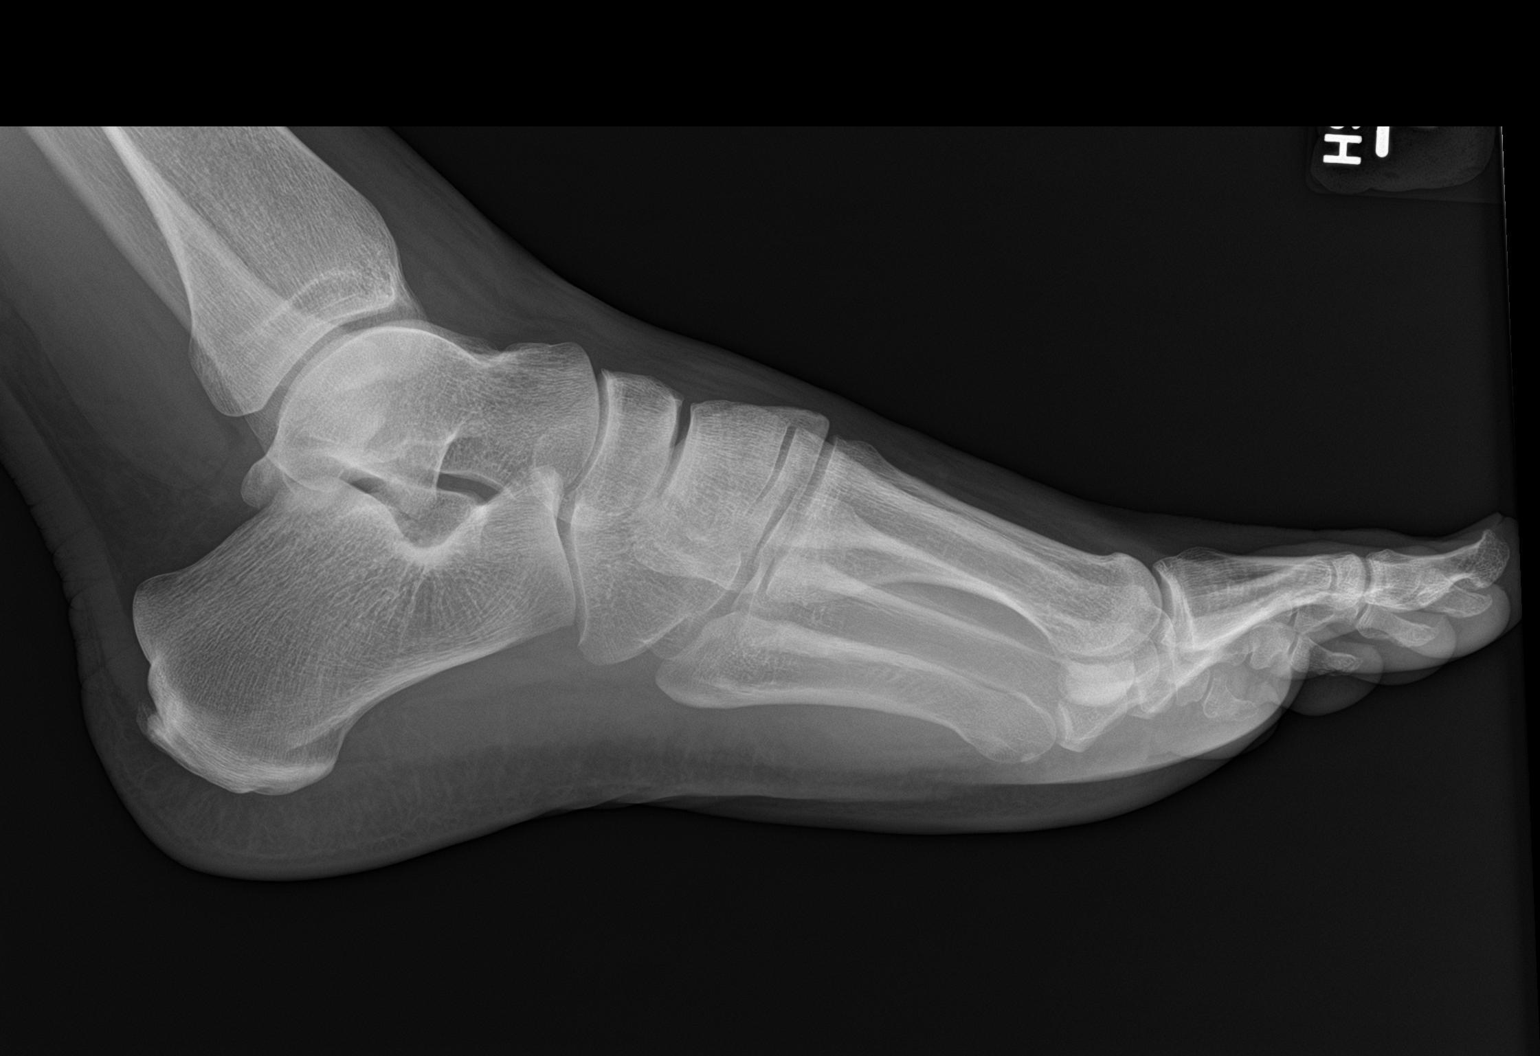

[3 of 3 positions shown; findings below may reference images not displayed]

FINDINGS: There is no evidence of fracture or dislocation. There is no
evidence of arthropathy or other focal bone abnormality. Soft
tissues are unremarkable.
IMPRESSION: Negative.

## 2022-08-17 ENCOUNTER — Emergency Department (HOSPITAL_COMMUNITY)
Admission: EM | Admit: 2022-08-17 | Discharge: 2022-08-17 | Disposition: A | Discharge status: Home / Self Care | Payer: 59 | Attending: Emergency Medicine | Admitting: Emergency Medicine

## 2022-08-17 ENCOUNTER — Emergency Department (HOSPITAL_COMMUNITY): Payer: 59

## 2022-08-17 ENCOUNTER — Other Ambulatory Visit: Payer: Self-pay

## 2022-08-17 ENCOUNTER — Encounter (HOSPITAL_COMMUNITY): Payer: Self-pay

## 2022-08-17 DIAGNOSIS — Z7951 Long term (current) use of inhaled steroids: Secondary | ICD-10-CM | POA: Insufficient documentation

## 2022-08-17 DIAGNOSIS — R079 Chest pain, unspecified: Secondary | ICD-10-CM | POA: Insufficient documentation

## 2022-08-17 DIAGNOSIS — J45909 Unspecified asthma, uncomplicated: Secondary | ICD-10-CM | POA: Insufficient documentation

## 2022-08-17 DIAGNOSIS — R0789 Other chest pain: Secondary | ICD-10-CM | POA: Diagnosis not present

## 2022-08-17 DIAGNOSIS — M542 Cervicalgia: Secondary | ICD-10-CM | POA: Diagnosis not present

## 2022-08-17 LAB — CBC
HCT: 44.4 % (ref 39.0–52.0)
Hemoglobin: 15.3 g/dL (ref 13.0–17.0)
MCH: 30.3 pg (ref 26.0–34.0)
MCHC: 34.5 g/dL (ref 30.0–36.0)
MCV: 87.9 fL (ref 80.0–100.0)
Platelets: 253 10*3/uL (ref 150–400)
RBC: 5.05 MIL/uL (ref 4.22–5.81)
RDW: 11.9 % (ref 11.5–15.5)
WBC: 9.2 10*3/uL (ref 4.0–10.5)
nRBC: 0 % (ref 0.0–0.2)

## 2022-08-17 LAB — TROPONIN I (HIGH SENSITIVITY)
Troponin I (High Sensitivity): 3 ng/L (ref <18)
Troponin I (High Sensitivity): 3 ng/L (ref <18)

## 2022-08-17 LAB — BASIC METABOLIC PANEL
Anion gap: 8 (ref 5–15)
BUN: 14 mg/dL (ref 6–20)
CO2: 27 mmol/L (ref 22–32)
Calcium: 9.3 mg/dL (ref 8.9–10.3)
Chloride: 104 mmol/L (ref 98–111)
Creatinine, Ser: 0.9 mg/dL (ref 0.61–1.24)
GFR, Estimated: >60 mL/min (ref >60)
Glucose, Bld: 103 mg/dL — ABNORMAL HIGH (ref 70–99)
Potassium: 4 mmol/L (ref 3.5–5.1)
Sodium: 139 mmol/L (ref 135–145)

## 2022-08-17 MED ORDER — KETOROLAC TROMETHAMINE 15 MG/ML IJ SOLN
15.0000 mg | Freq: Once | INTRAMUSCULAR | Status: AC
  Administered 2022-08-17: 15 mg via INTRAMUSCULAR
  Filled 2022-08-17: qty 1

## 2022-08-17 NOTE — Discharge Instructions (Signed)
You were seen today for chest pain.  Your workup was reassuring with no signs of acute coronary syndrome at this time.  If you have a sudden worsening of chest pain, shortness of breath, or other life-threatening symptoms, please return to the emergency department.  Otherwise please follow-up with your primary care provider as needed.

## 2022-08-17 NOTE — ED Provider Notes (Signed)
[place] EMERGENCY DEPARTMENT AT [facility] Provider Note   CSN: [id number] Arrival date & time: 08/17/22  1701     History  Chief Complaint  Patient presents with   Chest Pain    Andrej Crofts is a 32 y.o. male.  Patient presents to the emergency department complaining of substernal chest pain and left-sided neck pain secondary to a possible musculoskeletal injury.  Patient states that yesterday he was reaching for something with his left shoulder which is above his head and he felt a sudden sharp pain in his chest wall along with pain in the left side of his neck.  He does endorse previous history of chronic neck problems with radiation of symptoms into the upper shoulder and back region.  He denies previous chest pains like this.  Patient denies shortness of breath, abdominal pain, nausea, vomiting, headache.  Past medical history significant for chronic sinus infection, anxiety, chronic asthma  HPI     Home Medications Prior to Admission medications   Medication Sig Start Date End Date Taking? Authorizing Provider  ibuprofen (ADVIL) 200 MG tablet Take 800 mg by mouth every 8 (eight) hours as needed for moderate pain or headache.    [provider]  ipratropium (ATROVENT) 0.03 % nasal spray Place 2 sprays into both nostrils every 12 (twelve) hours. 05/21/22   Crain, Whitney L, PA  nitroGLYCERIN (NITROSTAT) 0.4 MG SL tablet Place 1 tablet (0.4 mg total) under the tongue every 5 (five) minutes as needed for chest pain. 07/07/22   Gilmore Laroche, FNP  predniSONE (DELTASONE) 10 MG tablet Take 4 tablets (40mg ) on days 1-4, then 3 tablets (30mg ) on days 5-8, then 2 tablets (20mg ) on days 9-11, then 1 tablet daily for days 12-14. Take with food. 07/01/22   Viviano Simas, FNP  triamcinolone ointment (KENALOG) 0.1 % Apply 1 Application topically 2 (two) times daily. Apply sparingly to affected areas for no longer than 14 days consecutively 08/29/21   Valentino Nose, NP   Vitamin D, Ergocalciferol, (DRISDOL) 1.25 MG (50000 UNIT) CAPS capsule Take 1 capsule (50,000 Units total) by mouth every 7 (seven) days. 07/07/22   Gilmore Laroche, FNP      Allergies    Ginger, Amoxicillin, and Penicillins    Review of Systems   Review of Systems  Physical Exam Updated Vital Signs BP 127/75   Pulse 67   Temp 98.3 F (36.8 C) (Oral)   Resp 17   Ht 6' (1.829 m)   Wt 124.7 kg   SpO2 100%   BMI 37.30 kg/m  Physical Exam Vitals and nursing note reviewed.  Constitutional:      General: He is not in acute distress.    Appearance: He is well-developed.  HENT:     Head: Normocephalic and atraumatic.  Eyes:     Conjunctiva/sclera: Conjunctivae normal.  Cardiovascular:     Rate and Rhythm: Normal rate and regular rhythm.     Heart sounds: No murmur heard. Pulmonary:     Effort: Pulmonary effort is normal. No respiratory distress.     Breath sounds: Normal breath sounds.  Chest:     Chest wall: Tenderness (Mild chest wall tenderness on left side) present.  Abdominal:     Palpations: Abdomen is soft.     Tenderness: There is no abdominal tenderness.  Musculoskeletal:        General: No swelling.     Cervical back: Neck supple.  Skin:    General: Skin is warm  and dry.     Capillary Refill: Capillary refill takes less than 2 seconds.  Neurological:     Mental Status: He is alert.  Psychiatric:        Mood and Affect: Mood normal.     ED Results / Procedures / Treatments   Labs (all labs ordered are listed, but only abnormal results are displayed) Labs Reviewed  BASIC METABOLIC PANEL - Abnormal; Notable for the following components:      Result Value   Glucose, Bld 103 (*)    All other components within normal limits  CBC  TROPONIN I (HIGH SENSITIVITY)  TROPONIN I (HIGH SENSITIVITY)    EKG None  Radiology DG Chest 2 View  Result Date: 08/17/2022 CLINICAL DATA:  Chest pain EXAM: CHEST - 2 VIEW COMPARISON:  Chest radiograph dated March 15, 2014 FINDINGS: The heart size and mediastinal contours are within normal limits. Both lungs are clear. The visualized skeletal structures are unremarkable. IMPRESSION: No active cardiopulmonary disease. Electronically Signed   By: Larose Hires D.O.   On: 08/17/2022 18:51    Procedures Procedures    Medications Ordered in ED Medications  ketorolac (TORADOL) 15 MG/ML injection 15 mg (15 mg Intramuscular Given 08/17/22 2230)    ED Course/ Medical Decision Making/ A&P                             Medical Decision Making Amount and/or Complexity of Data Reviewed Labs: ordered. Radiology: ordered.  Risk Prescription drug management.   This patient presents to the ED for concern of chest pain, this involves an extensive number of treatment options, and is a complaint that carries with it a high risk of complications and morbidity.  The differential diagnosis includes musculoskeletal pain, ACS, PE, anxiety, pneumonia, others   Co morbidities that complicate the patient evaluation  Anxiety   Additional history obtained:   External records from outside source obtained and reviewed including notes from primary care from May of this year.  Patient at that time complained of a sharp stabbing pain in the chest when stressed or irritated, thought to be having chest pains due to stress.  Patient was prescribed nitroglycerin at that time   Lab Tests:  I Ordered, and personally interpreted labs.  The pertinent results include: Grossly unremarkable CBC, BMP, troponin 3   Imaging Studies ordered:  I ordered imaging studies including chest x-ray I independently visualized and interpreted imaging which showed no acute findings I agree with the radiologist interpretation   Cardiac Monitoring: / EKG:  The patient was maintained on a cardiac monitor.  I personally viewed and interpreted the cardiac monitored which showed an underlying rhythm of: Sinus rhythm   Problem List / ED Course /  Critical interventions / Medication management   I ordered medication including Toradol for pain Reevaluation of the patient after these medicines showed that the patient improved I have reviewed the patients home medicines and have made adjustments as needed    Test / Admission - Considered:  Patient with troponin of 3, low heart score, nonischemic EKG.  Very low clinical suspicion of ACS.  No tachycardia, shortness of breath suggest pulmonary embolism at this time.  Patient with a negative PERC score.  No indication for CT angio chest at this time.  No clinical signs of dissection.  No pneumonia on chest x-ray.  Patient's pain began with a musculoskeletal movement.  I feel this is likely musculoskeletal  in nature.  Cannot rule out some anxiety component based on patient describing increases in pain when agitated and based on patient's history of the same.  Plan to discharge home at this time with return precautions.  Patient to follow-up with primary care as necessary.         Final Clinical Impression(s) / ED Diagnoses Final diagnoses:  Chest pain, unspecified type    Rx / DC Orders ED Discharge Orders     None         Pamala Duffel 08/17/22 2244    Lonell Grandchild, MD 08/18/22 1158

## 2022-08-17 NOTE — ED Triage Notes (Signed)
Pt reports L sided CP radiating to L arm after reaching for something last night. 8/10 "stabby pain right under sternum"

## 2022-10-07 ENCOUNTER — Ambulatory Visit (INDEPENDENT_AMBULATORY_CARE_PROVIDER_SITE_OTHER): Payer: 59 | Admitting: Family Medicine

## 2022-10-07 ENCOUNTER — Encounter: Payer: Self-pay | Admitting: Family Medicine

## 2022-10-07 VITALS — BP 132/77 | HR 76 | Ht 72.0 in | Wt 282.1 lb

## 2022-10-07 DIAGNOSIS — F411 Generalized anxiety disorder: Secondary | ICD-10-CM | POA: Diagnosis not present

## 2022-10-07 DIAGNOSIS — E7849 Other hyperlipidemia: Secondary | ICD-10-CM

## 2022-10-07 DIAGNOSIS — E785 Hyperlipidemia, unspecified: Secondary | ICD-10-CM | POA: Insufficient documentation

## 2022-10-07 MED ORDER — HYDROXYZINE HCL 25 MG PO TABS
25.0000 mg | ORAL_TABLET | Freq: Three times a day (TID) | ORAL | 0 refills | Status: DC | PRN
Start: 2022-10-07 — End: 2024-01-07

## 2022-10-07 NOTE — Assessment & Plan Note (Signed)
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 His wife is the primary provider for the household. He reports experiencing financial strain recently, leading to increased stress in caring for his children. He denies any suicidal thoughts or ideation. His GAD-7 score is 21.  The patient also reports back pain while previously taking Zoloft. Today, he will be treated with hydroxyzine 25 mg every 8 hours as needed for anxiety. He is advised to take 12.5 mg if the 25 mg dosage causes excessive drowsiness or sleepiness. A follow-up appointment is scheduled in 6 weeks.

## 2022-10-07 NOTE — Patient Instructions (Addendum)
I appreciate the opportunity to provide care to you today!    Follow up: 6 weeks  Labs: please stop by the lab during the week to get your blood drawn (CBC, CMP, TSH, Lipid profile, HgA1c, Vit D)   Nonpharmacologic management of anxiety and depression  Mindfulness and Meditation Practices like mindfulness meditation can help reduce symptoms by promoting relaxation and present-moment awareness.  Exercise  Regular physical activity has been shown to improve mood and reduce anxiety through the release of endorphins and other neurochemicals.  Healthy Diet Eating a balanced diet rich in fruits, vegetables, whole grains, and lean proteins can support overall mental health.  Sleep Hygiene  Establishing a regular sleep routine and ensuring good sleep quality can significantly impact mood and anxiety levels.  Stress Management Techniques Activities such as yoga, tai chi, and deep breathing exercises can help manage stress.  Social Support Maintaining strong relationships and seeking support from friends, family, or support groups can provide emotional comfort and reduce feelings of isolation.  Lifestyle Modifications Reducing alcohol and caffeine intake, quitting smoking, and avoiding recreational drugs can improve symptoms.  Art and Music Therapy Engaging in creative activities like painting, drawing, or playing music can be therapeutic and help express emotions.  Light Therapy Particularly useful for seasonal affective disorder (SAD), exposure to bright light can help regulate mood. .   Attached with your AVS, you will find valuable resources for self-education. I highly recommend dedicating some time to thoroughly examine them.   Please continue to a heart-healthy diet and increase your physical activities. Try to exercise for at least five days a week.    It was a pleasure to see you and I look forward to continuing to work together on your health and well-being. Please do not  hesitate to call the office if you need care or have questions about your care.  In case of emergency, please visit the Emergency Department for urgent care, or contact our clinic at [phone] to schedule an appointment. We're here to help you!   Have a wonderful day and week. With Gratitude, Gilmore Laroche MSN, FNP-BC

## 2022-10-07 NOTE — Progress Notes (Signed)
Plan extensive clinic extensive [phone]  Established Patient Office Visit  Subjective:  Patient ID: Justin Jordan, male    DOB: [birth year]  Age: 32 y.o. MRN: [id number]  CC:  Chief Complaint  Patient presents with   Care Management    3 month f/u, anxiety has worsened.    HPI Justin Jordan is a 32 y.o. male with past medical history of anxiety presents for f/u of  chronic medical conditions.  Zoloft  Past Medical History:  Diagnosis Date   Allergy    Anxiety    Asthma, chronic 05/06/2013   Followed in Pulmonary clinic/ [facility]/ [place]  - 05/04/2013 p extensive coaching HFA effectiveness =    75%    Chronic sinus infection    Depression     Past Surgical History:  Procedure Laterality Date   COLONOSCOPY WITH PROPOFOL N/A 10/29/2021   Procedure: COLONOSCOPY WITH PROPOFOL;  Surgeon: Dolores Frame, MD;  Location: [facility];  Service: Gastroenterology;  Laterality: N/A;  1130 ASA 1   POLYPECTOMY  10/29/2021   Procedure: POLYPECTOMY;  Surgeon: Marguerita Merles, Reuel Boom, MD;  Location: [facility];  Service: Gastroenterology;;    Family History  Problem Relation Age of Onset   Hypertension Father    Asthma Neg Hx     Social History   Socioeconomic History   Marital status: Married    Spouse name: Not on file   Number of children: Not on file   Years of education: Not on file   Highest education level: Not on file  Occupational History   Occupation: Conservation officer, nature at [employer]     Employer: [employer]  Tobacco Use   Smoking status: Never   Smokeless tobacco: Never  Vaping Use   Vaping status: Every Day   Substances: Nicotine, Flavoring  Substance and Sexual Activity   Alcohol use: Yes    Alcohol/week: 15.0 - 22.0 standard drinks of alcohol    Types: 14 Cans of beer, 1 - 8 Standard drinks or equivalent per week    Comment: 2 beers a night   Drug use: No   Sexual activity: Yes  Other Topics Concern   Not on file  Social History Narrative    Marital status: married since 10/2015.      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  Total partners = 12.  Last STD screening 2011.   Social Determinants of Health   Financial Resource Strain: Not on file  Food Insecurity: Not on file  Transportation Needs: Not on file  Physical Activity: Not on file  Stress: Not on file  Social Connections: Unknown (06/30/2021)   [facility]   Social Network    Social Network: Not on file  Intimate Partner Violence: Unknown (05/22/2021)   Received from [facility]   HITS    Physically Hurt: Not on file    Insult or Talk Down To: Not on file    Threaten Physical Harm: Not on file    Scream or Curse: Not on file  Outpatient Medications Prior to Visit  Medication Sig Dispense Refill   ibuprofen (ADVIL) 200 MG tablet Take 800 mg by mouth every 8 (eight) hours as needed for moderate pain or headache.     ipratropium (ATROVENT) 0.03 % nasal spray Place 2 sprays into both nostrils every 12 (twelve) hours. 30 mL 0   nitroGLYCERIN (NITROSTAT) 0.4 MG SL tablet Place 1 tablet (0.4 mg total) under the tongue every 5 (five) minutes as needed for chest pain. 50 tablet 3   predniSONE (DELTASONE) 10 MG tablet Take 4 tablets (40mg ) on days 1-4, then 3 tablets (30mg ) on days 5-8, then 2 tablets (20mg ) on days 9-11, then 1 tablet daily for days 12-14. Take with food. 37 tablet 0   triamcinolone ointment (KENALOG) 0.1 % Apply 1 Application topically 2 (two) times daily. Apply sparingly to affected areas for no longer than 14 days consecutively 30 g 0   Vitamin D, Ergocalciferol, (DRISDOL) 1.25 MG (50000 UNIT) CAPS capsule Take 1 capsule (50,000 Units  total) by mouth every 7 (seven) days. 20 capsule 1   No facility-administered medications prior to visit.    Allergies  Allergen Reactions   Ginger Anaphylaxis   Amoxicillin Hives   Penicillins Rash    ROS Review of Systems  Constitutional:  Negative for fatigue and fever.  Eyes:  Negative for visual disturbance.  Respiratory:  Negative for chest tightness and shortness of breath.   Cardiovascular:  Negative for chest pain and palpitations.  Neurological:  Negative for dizziness and headaches.      Objective:    Physical Exam HENT:     Head: Normocephalic.     Right Ear: External ear normal.     Left Ear: External ear normal.     Nose: No congestion or rhinorrhea.     Mouth/Throat:     Mouth: Mucous membranes are moist.  Cardiovascular:     Rate and Rhythm: Regular rhythm.     Heart sounds: No murmur heard. Pulmonary:     Effort: No respiratory distress.     Breath sounds: Normal breath sounds.  Neurological:     Mental Status: He is alert.     BP 132/77   Pulse 76   Ht 6' (1.829 m)   Wt 282 lb 1.3 oz (128 kg)   SpO2 95%   BMI 38.26 kg/m  Wt Readings from Last 3 Encounters:  10/07/22 282 lb 1.3 oz (128 kg)  08/17/22 275 lb (124.7 kg)  07/07/22 274 lb 0.6 oz (124.3 kg)    Lab Results  Component Value Date   TSH 1.690 01/22/2022   Lab Results  Component Value Date   WBC 9.2 08/17/2022   HGB 15.3 08/17/2022   HCT 44.4 08/17/2022   MCV 87.9 08/17/2022   PLT 253 08/17/2022   Lab Results  Component Value Date   NA 139 08/17/2022   K 4.0 08/17/2022   CO2 27 08/17/2022   GLUCOSE 103 (H) 08/17/2022   BUN 14 08/17/2022   CREATININE 0.90 08/17/2022   BILITOT 0.7 01/22/2022   ALKPHOS 75 01/22/2022   AST 38 01/22/2022   ALT 64 (H) 01/22/2022   PROT 7.3 01/22/2022   ALBUMIN 5.1 01/22/2022   CALCIUM 9.3 08/17/2022   ANIONGAP 8 08/17/2022   EGFR 98 01/22/2022   Lab Results  Component Value Date   CHOL 215 (H) 01/22/2022   Lab Results   Component Value Date   HDL 36 (L) 01/22/2022   Lab Results  Component  Value Date   LDLCALC 122 (H) 01/22/2022   Lab Results  Component Value Date   TRIG 324 (H) 01/22/2022   Lab Results  Component Value Date   CHOLHDL 6.0 (H) 01/22/2022   Lab Results  Component Value Date   HGBA1C 5.4 01/22/2022      Assessment & Plan:  Anxiety, generalized Assessment & Plan: 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 His wife is the primary provider for the household. He reports experiencing financial strain recently, leading to increased stress in caring for his children. He denies any suicidal thoughts or ideation. His GAD-7 score is 21.  The patient also reports back pain while previously taking Zoloft. Today, he will be treated with hydroxyzine 25 mg every 8 hours as needed for anxiety. He is advised to take 12.5 mg if the 25 mg dosage causes excessive drowsiness or sleepiness. A follow-up appointment is scheduled in 6 weeks.  Orders: -     hydrOXYzine HCl; Take 1 tablet (25 mg total) by mouth 3 (three) times daily as needed.  Dispense: 30 tablet; Refill: 0  Other hyperlipidemia Assessment & Plan: I educated the patient on the cardiovascular risks associated with hyperlipidemia. He was encouraged to reduce his intake of saturated fats, trans fats, and cholesterol, and to increase his intake of fruits, vegetables, whole grains, and omega-3 fatty acids, along with increasing physical activity. The patient verbalized understanding.   Note: This chart has been completed using Engineer, civil (consulting) software, and while attempts have been made to ensure accuracy, certain words and phrases may not be transcribed as intended.    Follow-up: Return in about 6 weeks (around 11/18/2022).   Gilmore Laroche, FNP

## 2022-10-07 NOTE — Assessment & Plan Note (Signed)
I educated the patient on the cardiovascular risks associated with hyperlipidemia. He was encouraged to reduce his intake of saturated fats, trans fats, and cholesterol, and to increase his intake of fruits, vegetables, whole grains, and omega-3 fatty acids, along with increasing physical activity. The patient verbalized understanding.

## 2022-10-10 ENCOUNTER — Telehealth: Payer: 59 | Admitting: Nurse Practitioner

## 2022-10-10 DIAGNOSIS — L247 Irritant contact dermatitis due to plants, except food: Secondary | ICD-10-CM | POA: Diagnosis not present

## 2022-10-10 MED ORDER — DESOXIMETASONE 0.25 % EX CREA
1.0000 | TOPICAL_CREAM | Freq: Two times a day (BID) | CUTANEOUS | 0 refills | Status: DC
Start: 2022-10-10 — End: 2024-01-07

## 2022-10-10 MED ORDER — PREDNISONE 10 MG (21) PO TBPK: ORAL_TABLET | ORAL | 0 refills | Status: DC

## 2022-10-10 NOTE — Progress Notes (Signed)
E Visit for Rash  We are sorry that you are not feeling well. Here is how we plan to help!  Based on what you shared with me it looks like you have contact dermatitis.  Contact dermatitis is a skin rash caused by something that touches the skin and causes irritation or inflammation.  Your skin may be red, swollen, dry, cracked, and itch.  The rash should go away in a few days but can last a few weeks.  If you get a rash, it's important to figure out what caused it so the irritant can be avoided in the future.      Prednisone 10 mg daily for 6 days (see taper instructions below)  Directions for 6 day taper: Day 1: 2 tablets before breakfast, 1 after both lunch & dinner and 2 at bedtime Day 2: 1 tab before breakfast, 1 after both lunch & dinner and 2 at bedtime Day 3: 1 tab at each meal & 1 at bedtime Day 4: 1 tab at breakfast, 1 at lunch, 1 at bedtime Day 5: 1 tab at breakfast & 1 tab at bedtime Day 6: 1 tab at breakfast    HOME CARE:  Take cool showers and avoid direct sunlight. Apply cool compress or wet dressings. Take a bath in an oatmeal bath.  Sprinkle content of one Aveeno packet under running faucet with comfortably warm water.  Bathe for 15-20 minutes, 1-2 times daily.  Pat dry with a towel. Do not rub the rash. Use hydrocortisone cream. Take an antihistamine like Benadryl for widespread rashes that itch.  The adult dose of Benadryl is 25-50 mg by mouth 4 times daily. Caution:  This type of medication may cause sleepiness.  Do not drink alcohol, drive, or operate dangerous machinery while taking antihistamines.  Do not take these medications if you have prostate enlargement.  Read package instructions thoroughly on all medications that you take.  GET HELP RIGHT AWAY IF:  Symptoms don't go away after treatment. Severe itching that persists. If you rash spreads or swells. If you rash begins to smell. If it blisters and opens or develops a yellow-brown crust. You develop a  fever. You have a sore throat. You become short of breath.  MAKE SURE YOU:  Understand these instructions. Will watch your condition. Will get help right away if you are not doing well or get worse.  Thank you for choosing an e-visit.  Your e-visit answers were reviewed by a board certified advanced clinical practitioner to complete your personal care plan. Depending upon the condition, your plan could have included both over the counter or prescription medications.  Please review your pharmacy choice. Make sure the pharmacy is open so you can pick up prescription now. If there is a problem, you may contact your provider through [employer] and have the prescription routed to another pharmacy.  Your safety is important to [place]. If you have drug allergies check your prescription carefully.   For the next 24 hours you can use MyChart to ask questions about today's visit, request a non-urgent call back, or ask for a work or school excuse. You will get an email in the next two days asking about your experience. I hope that your e-visit has been valuable and will speed your recovery.   Mary-Margaret Hassell Done, FNP   5-10 minutes spent reviewing and documenting in chart.

## 2022-10-10 NOTE — Addendum Note (Signed)
Addended by: Bennie Pierini on: 10/10/2022 04:19 PM   Modules accepted: Level of Service

## 2022-10-10 NOTE — Addendum Note (Signed)
Addended by: Bennie Pierini on: 10/10/2022 07:34 AM   Modules accepted: Orders

## 2022-11-18 ENCOUNTER — Encounter: Payer: Self-pay | Admitting: Family Medicine

## 2022-11-18 ENCOUNTER — Ambulatory Visit (INDEPENDENT_AMBULATORY_CARE_PROVIDER_SITE_OTHER): Payer: 59 | Admitting: Family Medicine

## 2022-11-18 VITALS — BP 142/78 | HR 82 | Ht 72.0 in | Wt 279.1 lb

## 2022-11-18 DIAGNOSIS — F411 Generalized anxiety disorder: Secondary | ICD-10-CM

## 2022-11-18 MED ORDER — ESCITALOPRAM OXALATE 10 MG PO TABS
10.0000 mg | ORAL_TABLET | Freq: Every day | ORAL | 1 refills | Status: DC
Start: 2022-11-18 — End: 2023-01-04

## 2022-11-18 NOTE — Patient Instructions (Addendum)
I appreciate the opportunity to provide care to you today!    Follow up:  6 weeks   Anxiety Management -Start taking Lexapro 10 mg daily. -It may take at least 2 weeks to start noticing benefits, and 6-8 weeks for the full effect of the medication. -Do not abruptly discontinue the medication to avoid withdrawal symptoms. -Please report any worsening mood or suicidal thoughts to your healthcare provider immediately. -Additionally, report any severe symptoms such as agitation, profuse sweating, racing heartbeat, dizziness, or hallucinations.  Nonpharmacologic management of anxiety   Mindfulness and Meditation Practices like mindfulness meditation can help reduce symptoms by promoting relaxation and present-moment awareness.  Exercise  Regular physical activity has been shown to improve mood and reduce anxiety through the release of endorphins and other neurochemicals.  Healthy Diet Eating a balanced diet rich in fruits, vegetables, whole grains, and lean proteins can support overall mental health.  Sleep Hygiene  Establishing a regular sleep routine and ensuring good sleep quality can significantly impact mood and anxiety levels.  Stress Management Techniques Activities such as yoga, tai chi, and deep breathing exercises can help manage stress.  Social Support Maintaining strong relationships and seeking support from friends, family, or support groups can provide emotional comfort and reduce feelings of isolation.  Lifestyle Modifications Reducing alcohol and caffeine intake, quitting smoking, and avoiding recreational drugs can improve symptoms.  Art and Music Therapy Engaging in creative activities like painting, drawing, or playing music can be therapeutic and help express emotions.  Light Therapy Particularly useful for seasonal affective disorder (SAD), exposure to bright light can help regulate mood. .     Please continue to a heart-healthy diet and increase your physical  activities. Try to exercise for at least five days a week.    It was a pleasure to see you and I look forward to continuing to work together on your health and well-being. Please do not hesitate to call the office if you need care or have questions about your care.  In case of emergency, please visit the Emergency Department for urgent care, or contact our clinic at [phone] to schedule an appointment. We're here to help you!   Have a wonderful day and week. With Gratitude, Gilmore Laroche MSN, FNP-BC

## 2022-11-18 NOTE — Progress Notes (Signed)
Established Patient Office Visit  Subjective:  Patient ID: Justin Jordan, male    DOB: 14-Nov-1990  Age: 32 y.o. MRN: [id number]  CC:  Chief Complaint  Patient presents with   Care Management    Follow up appt, pt reports cutting back on alcohol consumption.     HPI Justin Jordan is a 32 y.o. male presents for f/u of anxiety.  Anxiety:The patient reports experiencing excessive drowsiness and sleepiness while taking hydralazine 25 mg for his anxiety. The GAD-7 score in the clinic is 9, indicating moderate anxiety. He denies any suicidal thoughts or ideation.The patient expresses concern about increased financial strain but has managed to cut back on drinking, noting that he now drinks 2 beers once weekly every Friday night.   Past Medical History:  Diagnosis Date   Allergy    Anxiety    Asthma, chronic 05/06/2013   Followed in Pulmonary clinic/ [facility]/ [place]  - 05/04/2013 p extensive coaching HFA effectiveness =    75%    Chronic sinus infection    Depression     Past Surgical History:  Procedure Laterality Date   COLONOSCOPY WITH PROPOFOL N/A 10/29/2021   Procedure: COLONOSCOPY WITH PROPOFOL;  Surgeon: Dolores Frame, MD;  [facility]: [facility];  Service: Gastroenterology;  Laterality: N/A;  1130 ASA 1   POLYPECTOMY  10/29/2021   Procedure: POLYPECTOMY;  Surgeon: Marguerita Merles, Reuel Boom, MD;  Location: [facility];  Service: Gastroenterology;;    Family History  Problem Relation Age of Onset   Hypertension Father    Asthma Neg Hx     Social History   Socioeconomic History   Marital status: Married    Spouse name: Not on file   Number of children: Not on file   Years of education: Not on file   Highest education level: Not on file  Occupational History   Occupation: Conservation officer, nature at [employer]     Employer: [employer]  Tobacco Use   Smoking status: Never   Smokeless tobacco: Never  Vaping Use   Vaping status: Every Day   Substances:  Nicotine, Flavoring  Substance and Sexual Activity   Alcohol use: Yes    Alcohol/week: 15.0 - 22.0 standard drinks of alcohol    Types: 14 Cans of beer, 1 - 8 Standard drinks or equivalent per week    Comment: 2 beers a night   Drug use: No   Sexual activity: Yes  Other Topics Concern   Not on file  Social History Narrative   Marital status: married since 10/2015.      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  Total partners = 12.  Last STD screening 2011.   Social Determinants of Health   Financial Resource Strain: Not on file  Food Insecurity: Not on file  Transportation Needs: Not on file  Physical Activity: Not on file  Stress: Not on file  Social Connections: Unknown (06/30/2021)   Received from [facility]   Social Network    Social Network: Not on file  Intimate Partner  Violence: Unknown (05/22/2021)   Received from [facility]   HITS    Physically Hurt: Not on file    Insult or Talk Down To: Not on file    Threaten Physical Harm: Not on file    Scream or Curse: Not on file    Outpatient Medications Prior to Visit  Medication Sig Dispense Refill   desoximetasone (TOPICORT) 0.25 % cream Apply 1 Application topically 2 (two) times daily. 60 g 0   hydrOXYzine (ATARAX) 25 MG tablet Take 1 tablet (25 mg total) by mouth 3 (three) times daily as needed. 30 tablet 0   ibuprofen (ADVIL) 200 MG tablet Take 800 mg by mouth every 8 (eight) hours as needed for moderate pain or headache.     ipratropium (ATROVENT) 0.03 % nasal spray Place 2 sprays into both nostrils every 12 (twelve) hours. 30 mL 0   nitroGLYCERIN (NITROSTAT) 0.4 MG SL tablet Place 1 tablet (0.4 mg total) under  the tongue every 5 (five) minutes as needed for chest pain. 50 tablet 3   triamcinolone ointment (KENALOG) 0.1 % Apply 1 Application topically 2 (two) times daily. Apply sparingly to affected areas for no longer than 14 days consecutively 30 g 0   Vitamin D, Ergocalciferol, (DRISDOL) 1.25 MG (50000 UNIT) CAPS capsule Take 1 capsule (50,000 Units total) by mouth every 7 (seven) days. 20 capsule 1   predniSONE (DELTASONE) 10 MG tablet Take 4 tablets (40mg ) on days 1-4, then 3 tablets (30mg ) on days 5-8, then 2 tablets (20mg ) on days 9-11, then 1 tablet daily for days 12-14. Take with food. 37 tablet 0   predniSONE (STERAPRED UNI-PAK 21 TAB) 10 MG (21) TBPK tablet As directed x 6 days 21 tablet 0   No facility-administered medications prior to visit.    Allergies  Allergen Reactions   Ginger Anaphylaxis   Amoxicillin Hives   Penicillins Rash    ROS Review of Systems  Constitutional:  Negative for fatigue and fever.  Eyes:  Negative for visual disturbance.  Respiratory:  Negative for chest tightness and shortness of breath.   Cardiovascular:  Negative for chest pain and palpitations.  Neurological:  Negative for dizziness and headaches.      Objective:    Physical Exam HENT:     Head: Normocephalic.     Right Ear: External ear normal.     Left Ear: External ear normal.     Nose: No congestion or rhinorrhea.     Mouth/Throat:     Mouth: Mucous membranes are moist.  Cardiovascular:     Rate and Rhythm: Regular rhythm.     Heart sounds: No murmur heard. Pulmonary:     Effort: No respiratory distress.     Breath sounds: Normal breath sounds.  Neurological:     Mental Status: He is alert.     BP (!) 142/78 (BP Location: Left Arm)   Pulse 82   Ht 6' (1.829 m)   Wt 279 lb 1.3 oz (126.6 kg)   SpO2 96%   BMI 37.85 kg/m  Wt Readings from Last 3 Encounters:  11/18/22 279 lb 1.3 oz (126.6 kg)  10/07/22 282 lb 1.3 oz (128 kg)  08/17/22 275 lb (124.7 kg)    Lab Results   Component Value Date   TSH 1.690 01/22/2022   Lab Results  Component Value Date   WBC 9.2 08/17/2022   HGB 15.3 08/17/2022   HCT 44.4 08/17/2022   MCV 87.9 08/17/2022   PLT 253 08/17/2022  Lab Results  Component Value Date   NA 139 08/17/2022   K 4.0 08/17/2022   CO2 27 08/17/2022   GLUCOSE 103 (H) 08/17/2022   BUN 14 08/17/2022   CREATININE 0.90 08/17/2022   BILITOT 0.7 01/22/2022   ALKPHOS 75 01/22/2022   AST 38 01/22/2022   ALT 64 (H) 01/22/2022   PROT 7.3 01/22/2022   ALBUMIN 5.1 01/22/2022   CALCIUM 9.3 08/17/2022   ANIONGAP 8 08/17/2022   EGFR 98 01/22/2022   Lab Results  Component Value Date   CHOL 215 (H) 01/22/2022   Lab Results  Component Value Date   HDL 36 (L) 01/22/2022   Lab Results  Component Value Date   LDLCALC 122 (H) 01/22/2022   Lab Results  Component Value Date   TRIG 324 (H) 01/22/2022   Lab Results  Component Value Date   CHOLHDL 6.0 (H) 01/22/2022   Lab Results  Component Value Date   HGBA1C 5.4 01/22/2022      Assessment & Plan:  Anxiety, generalized Assessment & Plan: Anxiety Management -Start taking Lexapro 10 mg daily. -It may take at least 2 weeks to start noticing benefits, and 6-8 weeks for the full effect of the medication. -Do not abruptly discontinue the medication to avoid withdrawal symptoms. -Please report any worsening mood or suicidal thoughts to your healthcare provider immediately. -Additionally, report any severe symptoms such as agitation, profuse sweating, racing heartbeat, dizziness, or hallucinations.  Orders: -     Escitalopram Oxalate; Take 1 tablet (10 mg total) by mouth daily.  Dispense: 30 tablet; Refill: 1  Note: This chart has been completed using Engineer, civil (consulting) software, and while attempts have been made to ensure accuracy, certain words and phrases may not be transcribed as intended.    Follow-up: Return in about 6 weeks (around 12/30/2022).   Gilmore Laroche, FNP

## 2022-11-18 NOTE — Assessment & Plan Note (Signed)
Anxiety Management -Start taking Lexapro 10 mg daily. -It may take at least 2 weeks to start noticing benefits, and 6-8 weeks for the full effect of the medication. -Do not abruptly discontinue the medication to avoid withdrawal symptoms. -Please report any worsening mood or suicidal thoughts to your healthcare provider immediately. -Additionally, report any severe symptoms such as agitation, profuse sweating, racing heartbeat, dizziness, or hallucinations.

## 2022-12-30 ENCOUNTER — Ambulatory Visit: Payer: 59 | Admitting: Family Medicine

## 2023-01-01 ENCOUNTER — Encounter: Payer: Self-pay | Admitting: Family Medicine

## 2023-01-04 ENCOUNTER — Other Ambulatory Visit: Payer: Self-pay | Admitting: Family Medicine

## 2023-01-04 DIAGNOSIS — F411 Generalized anxiety disorder: Secondary | ICD-10-CM

## 2023-01-04 MED ORDER — DULOXETINE HCL 30 MG PO CPEP
30.0000 mg | ORAL_CAPSULE | Freq: Every day | ORAL | 1 refills | Status: DC
Start: 2023-01-04 — End: 2024-01-07

## 2023-01-04 NOTE — Progress Notes (Signed)
Na

## 2023-01-21 ENCOUNTER — Telehealth: Payer: 59 | Admitting: Family Medicine

## 2023-01-21 DIAGNOSIS — J4541 Moderate persistent asthma with (acute) exacerbation: Secondary | ICD-10-CM

## 2023-01-21 MED ORDER — BENZONATATE 200 MG PO CAPS: 200.0000 mg | ORAL_CAPSULE | Freq: Two times a day (BID) | ORAL | 0 refills | Status: DC | PRN

## 2023-01-21 MED ORDER — PREDNISONE 20 MG PO TABS: 20.0000 mg | ORAL_TABLET | Freq: Two times a day (BID) | ORAL | 0 refills | Status: AC

## 2023-01-21 NOTE — Progress Notes (Signed)
E-Visit for Cough   We are sorry that you are not feeling well.  Here is how we plan to help!  Based on your presentation I believe you most likely have A cough due to a virus.  This is called viral bronchitis and is best treated by rest, plenty of fluids and control of the cough.  You may use Ibuprofen or Tylenol as directed to help your symptoms.     In addition you may use A prescription cough medication called Tessalon Perles 100mg . You may take 1-2 capsules every 8 hours as needed for your cough.  Prednisone 10 mg daily for 6 days (see taper instructions below)  From your responses in the eVisit questionnaire you describe inflammation in the upper respiratory tract which is causing a significant cough.  This is commonly called Bronchitis and has four common causes:   Allergies Viral Infections Acid Reflux Bacterial Infection Allergies, viruses and acid reflux are treated by controlling symptoms or eliminating the cause. An example might be a cough caused by taking certain blood pressure medications. You stop the cough by changing the medication. Another example might be a cough caused by acid reflux. Controlling the reflux helps control the cough.  USE OF BRONCHODILATOR ("RESCUE") INHALERS: There is a risk from using your bronchodilator too frequently.  The risk is that over-reliance on a medication which only relaxes the muscles surrounding the breathing tubes can reduce the effectiveness of medications prescribed to reduce swelling and congestion of the tubes themselves.  Although you feel brief relief from the bronchodilator inhaler, your asthma may actually be worsening with the tubes becoming more swollen and filled with mucus.  This can delay other crucial treatments, such as oral steroid medications. If you need to use a bronchodilator inhaler daily, several times per day, you should discuss this with your provider.  There are probably better treatments that could be used to keep your  asthma under control.     HOME CARE Only take medications as instructed by your medical team. Complete the entire course of an antibiotic. Drink plenty of fluids and get plenty of rest. Avoid close contacts especially the very young and the elderly Cover your mouth if you cough or cough into your sleeve. Always remember to wash your hands A steam or ultrasonic humidifier can help congestion.   GET HELP RIGHT AWAY IF: You develop worsening fever. You become short of breath You cough up blood. Your symptoms persist after you have completed your treatment plan MAKE SURE YOU  Understand these instructions. Will watch your condition. Will get help right away if you are not doing well or get worse.    Thank you for choosing an e-visit.  Your e-visit answers were reviewed by a board certified advanced clinical practitioner to complete your personal care plan. Depending upon the condition, your plan could have included both over the counter or prescription medications.  Please review your pharmacy choice. Make sure the pharmacy is open so you can pick up prescription now. If there is a problem, you may contact your provider through [employer] and have the prescription routed to another pharmacy.  Your safety is important to [place]. If you have drug allergies check your prescription carefully.   For the next 24 hours you can use MyChart to ask questions about today's visit, request a non-urgent call back, or ask for a work or school excuse. You will get an email in the next two days asking about your experience. I hope  that your e-visit has been valuable and will speed your recovery.    have provided 5 minutes of non face to face time during this encounter for chart review and documentation.

## 2023-12-04 ENCOUNTER — Telehealth: Admitting: Physician Assistant

## 2023-12-04 DIAGNOSIS — T23109A Burn of first degree of unspecified hand, unspecified site, initial encounter: Secondary | ICD-10-CM

## 2023-12-04 MED ORDER — MUPIROCIN 2 % EX OINT
1.0000 | TOPICAL_OINTMENT | Freq: Two times a day (BID) | CUTANEOUS | 0 refills | Status: DC
Start: 2023-12-04 — End: 2024-01-07
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
## 2023-12-31 ENCOUNTER — Ambulatory Visit: Payer: Self-pay | Admitting: Family Medicine

## 2023-12-31 ENCOUNTER — Ambulatory Visit: Payer: Self-pay | Admitting: *Deleted

## 2023-12-31 NOTE — Telephone Encounter (Signed)
 FYI Only or Action Required?: FYI only for provider: appointment scheduled on 12/31/23.  Patient was last seen in primary care on 11/18/2022 by Edman Meade PEDLAR, FNP.  Called Nurse Triage reporting Dizziness.  Symptoms began yesterday.  Interventions attempted: Rest, hydration, or home remedies.  Symptoms are: gradually worsening.  Triage Disposition: See HCP Within 4 Hours (Or PCP Triage)  Patient/caregiver understands and will follow disposition?: Yes  Recommended if sx return or worsen go to ED or call 911.          Copied from CRM #[id number]. Topic: Clinical - Red Word Triage >> Dec 31, 2023 10:34 AM Antwanette L wrote: Red Word that prompted transfer to Nurse Triage: Brtitaney,the patient's wife, is calling to report that the patient is experiencing dizziness and a sensation of imbalance. His blood pressure is currently 125/86 Reason for Disposition  [1] Dizziness caused by heat exposure, sudden standing, or poor fluid intake AND [2] no improvement after 2 hours of rest and fluids    Unknown reason for dizziness. Dizziness is reported as less than last night  Answer Assessment - Initial Assessment Questions Appt scheduled today with other provider. None available with PCP.  Denies worsening sx today . No headache no blurred vision, no weakness on either side of body , no speech issues. No N/T face or one side of body. Reports numbness in hands at times. Dizziness  and reports or balance issues not like last night.  No feeling of passing out or fainting. No sinus issues or pain in ears. Recommended if sx worsen go to ED or call 911.        1. DESCRIPTION: Describe your dizziness. Dizziness sitting up and felt unbalanced last night 2. LIGHTHEADED: Do you feel lightheaded? (e.g., somewhat faint, woozy, weak upon standing)     Dizziness mild now  denies weakness standing and denies feeling faint 3. VERTIGO: Do you feel like either you or the room is spinning or  tilting? (i.e., vertigo)     Laying down room spinning when closing eyes , unbalanced like drunk last night. Denies feeling unbalanced now.  4. SEVERITY: How bad is it?  Do you feel like you are going to faint? Can you stand and walk?    Can walk. Feels some No feeling of faint.  5. ONSET:  When did the dizziness begin?     Last night  6. AGGRAVATING FACTORS: Does anything make it worse? (e.g., standing, change in head position)     Change in head position 7. HEART RATE: Can you tell me your heart rate? How many beats in 15 seconds?  (Note: Not all patients can do this.)       Na  8. CAUSE: What do you think is causing the dizziness? (e.g., decreased fluids or food, diarrhea, emotional distress, heat exposure, new medicine, sudden standing, vomiting; unknown)     Not sure , reports dizziness better now no feeling like unbalanced like last night. Hungry a lot.  9. RECURRENT SYMPTOM: Have you had dizziness before? If Yes, ask: When was the last time? What happened that time?     Last night  10. OTHER SYMPTOMS: Do you have any other symptoms? (e.g., fever, chest pain, vomiting, diarrhea, bleeding)       No chest pain no difficult breathing , dizziness, balance issues last night numbness in hands at times but not on one side of body or face.   No headaches no blurred vision, no speech issues. BP 125/86 before  call and rechecked during triage for 142/86.   11. PREGNANCY: Is there any chance you are pregnant? When was your last menstrual period?       na  Protocols used: Dizziness - Lightheadedness-A-AH

## 2023-12-31 NOTE — Telephone Encounter (Signed)
 Noted

## 2024-01-07 ENCOUNTER — Encounter: Payer: Self-pay | Admitting: Family Medicine

## 2024-01-07 ENCOUNTER — Ambulatory Visit (INDEPENDENT_AMBULATORY_CARE_PROVIDER_SITE_OTHER): Payer: Self-pay | Admitting: Family Medicine

## 2024-01-07 VITALS — BP 144/88 | HR 72 | Temp 98.4°F | Ht 72.0 in | Wt 292.8 lb

## 2024-01-07 DIAGNOSIS — R42 Dizziness and giddiness: Secondary | ICD-10-CM

## 2024-01-07 LAB — CBC WITH DIFFERENTIAL/PLATELET
Absolute Lymphocytes: 3114 {cells}/uL (ref 850–3900)
Absolute Monocytes: 1086 {cells}/uL — ABNORMAL HIGH (ref 200–950)
Basophils Absolute: 58 {cells}/uL (ref 0–200)
Basophils Relative: 0.6 %
Eosinophils Absolute: 223 {cells}/uL (ref 15–500)
Eosinophils Relative: 2.3 %
HCT: 45.3 % (ref 38.5–50.0)
Hemoglobin: 15.4 g/dL (ref 13.2–17.1)
MCH: 30.6 pg (ref 27.0–33.0)
MCHC: 34 g/dL (ref 32.0–36.0)
MCV: 90.1 fL (ref 80.0–100.0)
MPV: 10.2 fL (ref 7.5–12.5)
Monocytes Relative: 11.2 %
Neutro Abs: 5219 {cells}/uL (ref 1500–7800)
Neutrophils Relative %: 53.8 %
Platelets: 270 Thousand/uL (ref 140–400)
RBC: 5.03 Million/uL (ref 4.20–5.80)
RDW: 12.3 % (ref 11.0–15.0)
Total Lymphocyte: 32.1 %
WBC: 9.7 Thousand/uL (ref 3.8–10.8)

## 2024-01-07 LAB — COMPREHENSIVE METABOLIC PANEL WITH GFR
AG Ratio: 2.1 (calc) (ref 1.0–2.5)
ALT: 61 U/L — ABNORMAL HIGH (ref 9–46)
AST: 33 U/L (ref 10–40)
Albumin: 4.9 g/dL (ref 3.6–5.1)
Alkaline phosphatase (APISO): 84 U/L (ref 36–130)
BUN: 13 mg/dL (ref 7–25)
CO2: 29 mmol/L (ref 20–32)
Calcium: 9.8 mg/dL (ref 8.6–10.3)
Chloride: 103 mmol/L (ref 98–110)
Creat: 0.93 mg/dL (ref 0.60–1.26)
Globulin: 2.3 g/dL (ref 1.9–3.7)
Glucose, Bld: 74 mg/dL (ref 65–99)
Potassium: 4.3 mmol/L (ref 3.5–5.3)
Sodium: 141 mmol/L (ref 135–146)
Total Bilirubin: 0.6 mg/dL (ref 0.2–1.2)
Total Protein: 7.2 g/dL (ref 6.1–8.1)
eGFR: 112 mL/min/1.73m2 (ref >=60)

## 2024-01-07 NOTE — Progress Notes (Signed)
 Subjective:    Patient ID: Justin Jordan, male    DOB: [birth year], 33 y.o.   MRN: [id number]  HPI Patient is a 33 year old Caucasian gentleman who presents complaining of dizziness.  He states that last Thursday he was in his normal state of health.  He had not done any vigorous physical activity that day that would explain dehydration.  He was not taking any type of medication that would lower his blood pressure.  He denies taking any alcohol or illicit substances.  He states that that night, after intercourse with his wife, he stood up and felt very dizzy.  He felt unstable on his feet.  He was having to hold the walls to the walk.  He denies any syncope or near syncope.  He states that he slept sitting up.  If he would close his eyes, he felt like the room was spinning.  The following morning, he had a dull mild headache in his occiput but was otherwise normal.  Over the last week he denies any further episodes of dizziness.  He denies any syncope or near syncope.  He denies any lightheadedness.  Cranial nerves II through XII are grossly intact today with muscle strength 5/5 equal and symmetric in the upper and lower extremities. Past Medical History:  Diagnosis Date   Allergy    Anxiety    Asthma, chronic 05/06/2013   Followed in Pulmonary clinic/ [facility]/ [place]  - 05/04/2013 p extensive coaching HFA effectiveness =    75%    Chronic sinus infection    Depression    Past Surgical History:  Procedure Laterality Date   COLONOSCOPY WITH PROPOFOL  N/A 10/29/2021   Procedure: COLONOSCOPY WITH PROPOFOL ;  Surgeon: Eartha Angelia Sieving, MD;  Location: [facility];  Service: Gastroenterology;  Laterality: N/A;  1130 ASA 1   POLYPECTOMY  10/29/2021   Procedure: POLYPECTOMY;  Surgeon: Eartha Angelia, Sieving, MD;  Location: [facility];  Service: Gastroenterology;;   Current Outpatient Medications on File Prior to Visit  Medication Sig Dispense Refill   benzonatate  (TESSALON )  200 MG capsule Take 1 capsule (200 mg total) by mouth 2 (two) times daily as needed for cough. 20 capsule 0   desoximetasone  (TOPICORT ) 0.25 % cream Apply 1 Application topically 2 (two) times daily. 60 g 0   DULoxetine  (CYMBALTA ) 30 MG capsule Take 1 capsule (30 mg total) by mouth daily. 60 capsule 1   hydrOXYzine  (ATARAX ) 25 MG tablet Take 1 tablet (25 mg total) by mouth 3 (three) times daily as needed. 30 tablet 0   ibuprofen  (ADVIL ) 200 MG tablet Take 800 mg by mouth every 8 (eight) hours as needed for moderate pain or headache.     ipratropium (ATROVENT ) 0.03 % nasal spray Place 2 sprays into both nostrils every 12 (twelve) hours. 30 mL 0   mupirocin  ointment (BACTROBAN ) 2 % Apply 1 Application topically 2 (two) times daily. 22 g 0   nitroGLYCERIN  (NITROSTAT ) 0.4 MG SL tablet Place 1 tablet (0.4 mg total) under the tongue every 5 (five) minutes as needed for chest pain. 50 tablet 3   triamcinolone  ointment (KENALOG ) 0.1 % Apply 1 Application topically 2 (two) times daily. Apply sparingly to affected areas for no longer than 14 days consecutively 30 g 0   Vitamin D , Ergocalciferol , (DRISDOL ) 1.25 MG (50000 UNIT) CAPS capsule Take 1 capsule (50,000 Units total) by mouth every 7 (seven) days. 20 capsule 1   No current facility-administered medications on file prior to visit.  Allergies  Allergen Reactions   Ginger Anaphylaxis   Amoxicillin  Hives   Penicillins Rash   Social History   Socioeconomic History   Marital status: Married    Spouse name: Not on file   Number of children: Not on file   Years of education: Not on file   Highest education level: Not on file  Occupational History   Occupation: Conservation Officer, [employer] at [employer]     Employer: [employer]  Tobacco Use   Smoking status: Never   Smokeless tobacco: Never  Vaping Use   Vaping status: Every Day   Substances: Nicotine, Flavoring  Substance and Sexual Activity   Alcohol use: Yes    Alcohol/week: 15.0 - 22.0 standard drinks of  alcohol    Types: 14 Cans of beer, 1 - 8 Standard drinks or equivalent per week    Comment: 2 beers a night   Drug use: No   Sexual activity: Yes  Other Topics Concern   Not on file  Social History Narrative   Marital status: married since 10/2015.      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  Total partners = 12.  Last STD screening 2011.   Social Drivers of Corporate Investment Banker Strain: Not on file  Food Insecurity: Not on file  Transportation Needs: Not on file  Physical Activity: Not on file  Stress: Not on file  Social Connections: Unknown (06/30/2021)   [facility]   Social Network    Social Network: Not on file  Intimate Partner Violence: Unknown (05/22/2021)   Received from [facility]   HITS    Physically Hurt: Not on file    Insult or Talk Down To: Not on file    Threaten Physical Harm: Not on file    Scream or Curse: Not on file   Family History  Problem Relation Age of Onset   Hypertension Father    Asthma Neg Hx      Review of Systems  All other systems reviewed and are negative.      Objective:   Physical Exam Vitals reviewed.  Constitutional:      Appearance: Normal appearance. He is normal weight.  HENT:     Right Ear: Tympanic membrane and ear canal normal.     Left Ear: Tympanic membrane and ear canal normal.  Neck:     Vascular: No carotid bruit.  Cardiovascular:     Rate and Rhythm: Normal rate and regular rhythm.     Pulses: Normal pulses.     Heart sounds: Normal heart sounds. No murmur heard.    No friction rub. No gallop.  Pulmonary:     Effort: Pulmonary effort is normal. No respiratory distress.     Breath  sounds: Normal breath sounds. No stridor. No wheezing, rhonchi or rales.  Chest:     Chest wall: No tenderness.  Abdominal:     General: Abdomen is flat. Bowel sounds are normal.     Palpations: Abdomen is soft.  Neurological:  General: No focal deficit present.     Mental Status: He is alert and oriented to person, place, and time.     Cranial Nerves: No cranial nerve deficit.     Sensory: No sensory deficit.     Motor: No weakness.     Coordination: Coordination normal.     Gait: Gait normal.     Deep Tendon Reflexes: Reflexes normal.          Assessment & Plan:  Dizzy - Plan: CBC with Differential/Platelet, Comprehensive metabolic panel with GFR Patient's physical exam today is normal and his Dix-Hallpike maneuver is negative.  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  Symptoms have completely subsided.  Therefore I will check blood work to rule out severe anemia or diabetes or other conditions that could cause hypotension.  If labs are normal and symptoms remain resolved no further workup is necessary.

## 2024-01-10 ENCOUNTER — Ambulatory Visit: Payer: Self-pay | Admitting: Family Medicine
